# Patient Record
Sex: Female | Born: 1955 | Race: White | Hispanic: No | Marital: Married | Smoking: Never smoker
Health system: Southern US, Community
[De-identification: ages and names within clinical notes are randomized; demographics above are authoritative.]

## PROBLEM LIST (undated history)

## (undated) DIAGNOSIS — I4891 Unspecified atrial fibrillation: Secondary | ICD-10-CM

## (undated) DIAGNOSIS — F32A Depression, unspecified: Secondary | ICD-10-CM

## (undated) DIAGNOSIS — I10 Essential (primary) hypertension: Secondary | ICD-10-CM

## (undated) DIAGNOSIS — R87619 Unspecified abnormal cytological findings in specimens from cervix uteri: Secondary | ICD-10-CM

## (undated) DIAGNOSIS — R112 Nausea with vomiting, unspecified: Secondary | ICD-10-CM

## (undated) DIAGNOSIS — Z9889 Other specified postprocedural states: Secondary | ICD-10-CM

## (undated) DIAGNOSIS — L900 Lichen sclerosus et atrophicus: Secondary | ICD-10-CM

## (undated) DIAGNOSIS — I341 Nonrheumatic mitral (valve) prolapse: Secondary | ICD-10-CM

## (undated) DIAGNOSIS — F419 Anxiety disorder, unspecified: Secondary | ICD-10-CM

## (undated) DIAGNOSIS — F329 Major depressive disorder, single episode, unspecified: Secondary | ICD-10-CM

## (undated) DIAGNOSIS — H9190 Unspecified hearing loss, unspecified ear: Secondary | ICD-10-CM

## (undated) DIAGNOSIS — E785 Hyperlipidemia, unspecified: Secondary | ICD-10-CM

## (undated) HISTORY — DX: Anxiety disorder, unspecified: F41.9

## (undated) HISTORY — DX: Nonrheumatic mitral (valve) prolapse: I34.1

## (undated) HISTORY — PX: COLPOSCOPY: SHX161

## (undated) HISTORY — PX: LAPAROSCOPY: SHX197

## (undated) HISTORY — DX: Essential (primary) hypertension: I10

## (undated) HISTORY — DX: Lichen sclerosus et atrophicus: L90.0

## (undated) HISTORY — DX: Unspecified abnormal cytological findings in specimens from cervix uteri: R87.619

## (undated) HISTORY — DX: Unspecified hearing loss, unspecified ear: H91.90

---

## 1998-03-23 ENCOUNTER — Encounter: Admission: RE | Admit: 1998-03-23 | Discharge: 1998-06-21 | Payer: Self-pay | Admitting: Family Medicine

## 2000-05-09 ENCOUNTER — Encounter: Payer: Self-pay | Admitting: *Deleted

## 2000-05-09 ENCOUNTER — Encounter: Admission: RE | Admit: 2000-05-09 | Discharge: 2000-05-09 | Payer: Self-pay | Admitting: *Deleted

## 2001-01-31 ENCOUNTER — Encounter: Admission: RE | Admit: 2001-01-31 | Discharge: 2001-01-31 | Payer: Self-pay | Admitting: Orthopedic Surgery

## 2001-01-31 ENCOUNTER — Encounter: Payer: Self-pay | Admitting: Orthopedic Surgery

## 2005-01-11 ENCOUNTER — Encounter: Admission: RE | Admit: 2005-01-11 | Discharge: 2005-01-11 | Payer: Self-pay | Admitting: Family Medicine

## 2007-07-29 ENCOUNTER — Other Ambulatory Visit: Admission: RE | Admit: 2007-07-29 | Discharge: 2007-07-29 | Payer: Self-pay | Admitting: Obstetrics & Gynecology

## 2009-08-10 ENCOUNTER — Encounter: Admission: RE | Admit: 2009-08-10 | Discharge: 2009-08-10 | Payer: Self-pay | Admitting: Obstetrics and Gynecology

## 2012-01-16 ENCOUNTER — Other Ambulatory Visit (HOSPITAL_COMMUNITY)
Admission: RE | Admit: 2012-01-16 | Discharge: 2012-01-16 | Disposition: A | Discharge status: Home / Self Care | Payer: BC Managed Care – PPO | Source: Ambulatory Visit | Attending: Family Medicine | Admitting: Family Medicine

## 2012-01-16 ENCOUNTER — Other Ambulatory Visit: Payer: Self-pay | Admitting: Family Medicine

## 2012-01-16 DIAGNOSIS — Z0000 Encounter for general adult medical examination without abnormal findings: Secondary | ICD-10-CM | POA: Insufficient documentation

## 2012-01-23 ENCOUNTER — Other Ambulatory Visit: Payer: Self-pay | Admitting: Family Medicine

## 2012-01-23 DIAGNOSIS — Z780 Asymptomatic menopausal state: Secondary | ICD-10-CM

## 2012-02-11 ENCOUNTER — Ambulatory Visit
Admission: RE | Admit: 2012-02-11 | Discharge: 2012-02-11 | Disposition: A | Discharge status: Home / Self Care | Payer: BC Managed Care – PPO | Source: Ambulatory Visit | Attending: Family Medicine | Admitting: Family Medicine

## 2012-02-11 DIAGNOSIS — Z780 Asymptomatic menopausal state: Secondary | ICD-10-CM

## 2013-02-23 ENCOUNTER — Encounter: Payer: Self-pay | Admitting: Certified Nurse Midwife

## 2013-02-24 ENCOUNTER — Ambulatory Visit (INDEPENDENT_AMBULATORY_CARE_PROVIDER_SITE_OTHER): Payer: BC Managed Care – PPO | Admitting: Certified Nurse Midwife

## 2013-02-24 ENCOUNTER — Encounter: Payer: Self-pay | Admitting: Certified Nurse Midwife

## 2013-02-24 VITALS — BP 114/72 | HR 72 | Resp 16 | Ht 62.25 in | Wt 119.0 lb

## 2013-02-24 DIAGNOSIS — N904 Leukoplakia of vulva: Secondary | ICD-10-CM

## 2013-02-24 DIAGNOSIS — N951 Menopausal and female climacteric states: Secondary | ICD-10-CM

## 2013-02-24 DIAGNOSIS — Z0000 Encounter for general adult medical examination without abnormal findings: Secondary | ICD-10-CM

## 2013-02-24 DIAGNOSIS — L940 Localized scleroderma [morphea]: Secondary | ICD-10-CM

## 2013-02-24 DIAGNOSIS — Z01419 Encounter for gynecological examination (general) (routine) without abnormal findings: Secondary | ICD-10-CM

## 2013-02-24 DIAGNOSIS — N952 Postmenopausal atrophic vaginitis: Secondary | ICD-10-CM

## 2013-02-24 LAB — POCT URINALYSIS DIPSTICK
Blood, UA: NEGATIVE
Glucose, UA: NEGATIVE
Ketones, UA: NEGATIVE
Protein, UA: NEGATIVE
Urobilinogen, UA: NEGATIVE

## 2013-02-24 NOTE — Patient Instructions (Addendum)
EXERCISE AND DIET:  We recommended that you start or continue a regular exercise program for good health. Regular exercise means any activity that makes your heart beat faster and makes you sweat.  We recommend exercising at least 30 minutes per day at least 3 days a week, preferably 4 or 5.  We also recommend a diet low in fat and sugar.  Inactivity, poor dietary choices and obesity can cause diabetes, heart attack, stroke, and kidney damage, among others.    ALCOHOL AND SMOKING:  Women should limit their alcohol intake to no more than 7 drinks/beers/glasses of wine (combined, not each!) per week. Moderation of alcohol intake to this level decreases your risk of breast cancer and liver damage. And of course, no recreational drugs are part of a healthy lifestyle.  And absolutely no smoking or even second hand smoke. Most people know smoking can cause heart and lung diseases, but did you know it also contributes to weakening of your bones? Aging of your skin?  Yellowing of your teeth and nails?  CALCIUM AND VITAMIN D:  Adequate intake of calcium and Vitamin D are recommended.  The recommendations for exact amounts of these supplements seem to change often, but generally speaking 600 mg of calcium (either carbonate or citrate) and 800 units of Vitamin D per day seems prudent. Certain women may benefit from higher intake of Vitamin D.  If you are among these women, your doctor will have told you during your visit.    PAP SMEARS:  Pap smears, to check for cervical cancer or precancers,  have traditionally been done yearly, although recent scientific advances have shown that most women can have pap smears less often.  However, every woman still should have a physical exam from her gynecologist every year. It will include a breast check, inspection of the vulva and vagina to check for abnormal growths or skin changes, a visual exam of the cervix, and then an exam to evaluate the size and shape of the uterus and  ovaries.  And after 57 years of age, a rectal exam is indicated to check for rectal cancers. We will also provide age appropriate advice regarding health maintenance, like when you should have certain vaccines, screening for sexually transmitted diseases, bone density testing, colonoscopy, mammograms, etc.   MAMMOGRAMS:  All women over 40 years old should have a yearly mammogram. Many facilities now offer a "3D" mammogram, which may cost around $50 extra out of pocket. If possible,  we recommend you accept the option to have the 3D mammogram performed.  It both reduces the number of women who will be called back for extra views which then turn out to be normal, and it is better than the routine mammogram at detecting truly abnormal areas.    COLONOSCOPY:  Colonoscopy to screen for colon cancer is recommended for all women at age 50.  We know, you hate the idea of the prep.  We agree, BUT, having colon cancer and not knowing it is worse!!  Colon cancer so often starts as a polyp that can be seen and removed at colonscopy, which can quite literally save your life!  And if your first colonoscopy is normal and you have no family history of colon cancer, most women don't have to have it again for 10 years.  Once every ten years, you can do something that may end up saving your life, right?  We will be happy to help you get it scheduled when you are ready.    Be sure to check your insurance coverage so you understand how much it will cost.  It may be covered as a preventative service at no cost, but you should check your particular policy.     Lichen Sclerosus Lichen sclerosus is a skin problem. It can happen on any part of the body, but it commonly involves the anal or genital areas. Lichen sclerosus is not an infection or a fungus. Girls and women are more commonly affected than boys and men. CAUSES The cause is not known. It could be the result of an overactive immune system or a lack of certain hormones.  Lichen sclerosus is not passed from one person to another (not contagious). SYMPTOMS Your skin may have:  Thin, wrinkled, white areas.  Thickened white areas.  Red and swollen patches.  Tears or cracks.  Bruising.  Blood blisters.  Severe itching. You may also have pain, itching, or burning with urination. Constipation is also common in people with lichen sclerosus. DIAGNOSIS Your caregiver will do a physical exam. Sometimes, a tissue sample (biopsy) may be sent for testing. TREATMENT Treatment may involve putting a thin layer of medicated cream (topical steroid) over the areas with lichen sclerosus. Use the cream only as directed by your caregiver.  HOME CARE INSTRUCTIONS  Only take over-the-counter or prescription medicines as directed by your caregiver.  Keep the vaginal area as clean and dry as possible. SEEK MEDICAL CARE IF: You develop increasing pain, swelling, or redness. Document Released: 11/29/2010 Document Revised: 10/01/2011 Document Reviewed: 11/29/2010 [facility] Patient Information 2014 [place], [place]. Atrophic Vaginitis Atrophic vaginitis is a problem of low levels of estrogen in women. This problem can happen at any age. It is most common in women who have gone through menopause ("the change").  HOW WILL I KNOW IF I HAVE THIS PROBLEM? You may have:  Trouble with peeing (urinating), such as:  Going to the bathroom often.  A hard time holding your pee until you reach a bathroom.  Leaking pee.  Having pain when you pee.  Itching or a burning feeling.  Vaginal bleeding and spotting.  Pain during sex.  Dryness of the vagina.  A yellow, bad-smelling fluid (discharge) coming from the vagina. HOW WILL MY DOCTOR CHECK FOR THIS PROBLEM?  During your exam, your doctor will likely find the problem.  If there is a vaginal fluid, it may be checked for infection. HOW WILL THIS PROBLEM BE TREATED? Keep the vulvar skin as clean as possible. Moisturizers  and lubricants can help with some of the symptoms. Estrogen replacement can help. There are 2 ways to take estrogen:  Systemic estrogen gets estrogen to your whole body. It takes many weeks or months before the symptoms get better.  You take an estrogen pill.  You use a skin patch. This is a patch that you put on your skin.  If you still have your uterus, your doctor may ask you to take a hormone. Talk to your doctor about the right medicine for you.  Estrogen cream.  This puts estrogen only at the part of your body where you apply it. The cream is put into the vagina or put on the vulvar skin. For some women, estrogen cream works faster than pills or the patch. CAN ALL WOMEN WITH THIS PROBLEM USE ESTROGEN? No. Women with certain types of cancer, liver problems, or problems with blood clots should not take estrogen. Your doctor can help you decide the best treatment for your symptoms. Document Released: 12/26/2007 Document  Revised: 10/01/2011 Document Reviewed: 12/26/2007 [facility] Patient Information 2014 [place], [place].

## 2013-02-24 NOTE — Progress Notes (Signed)
57 y.o. G82P1001 Married Caucasian Fe here for annual exam. Menopausal now? No period in more than one year. Denies any spotting or bleeding since LMP. Occasional hot flashes, no insomnia issues. Patient does have vaginal dryness and has tried OTC products with nothing but burning each time. Has stopped all sexual activity, for the past year. Patient very concerned with estrogen use vaginally. Would never use oral HRT. Patient on medication for anxiety and working well with PCP management. See PCP aex, labs. No other health issues today. 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  Patient's last menstrual period was 10/22/2011.          Sexually active: no  The current method of family planning is vasectomy.    Exercising: yes  walking & weights Smoker:  no  Health Maintenance: Pap:  12-29-09 neg MMG: 08-10-09, pt unsure if one was done in 2012 Colonoscopy:  none BMD:  02-11-12 TDaP:  6/10 Labs: Poct urine-neg, Hgb- Self breast exam: not done   reports that she has never smoked. She does not have any smokeless tobacco history on file. She reports that she drinks about 1.0 ounces of alcohol per week. She reports that she does not use illicit drugs.  Past Medical History  Diagnosis Date  . Hypertension   . MVP (mitral valve prolapse)   . Anxiety   . Hearing loss     bilateral hearing aids    Past Surgical History  Procedure Laterality Date  . Laparoscopy      exploratory  . Cesarean section      Current Outpatient Prescriptions  Medication Sig Dispense Refill  . ALPRAZolam (XANAX) 0.5 MG tablet Take 0.5 mg by mouth 2 (two) times daily as needed.       . Glucosamine HCl (GLUCOSAMINE PO) Take by mouth daily.      Marland Kitchen losartan-hydrochlorothiazide (HYZAAR) 50-12.5 MG per tablet Take 1 tablet by mouth daily.      . Multiple Vitamins-Minerals (MULTIVITAMIN PO) Take by mouth daily.      Marland Kitchen PARoxetine HCl (PAXIL PO) Take 40 mg by mouth daily.        No current facility-administered medications for this visit.    Family History  Problem Relation Age of Onset  . Osteoporosis Mother   . Hypertension Mother   . Diabetes Father     ROS:  Pertinent items are noted in HPI.  Otherwise, a comprehensive ROS was negative.  Exam:   BP 114/72  Pulse 72  Resp 16  Ht 5' 2.25" (1.581 m)  Wt 119 lb (53.978 kg)  BMI 21.59 kg/m2  LMP 10/22/2011 Height: 5' 2.25" (158.1 cm)  Ht Readings from Last 3 Encounters:  02/24/13 5' 2.25" (1.581 m)    General appearance: alert, cooperative and appears stated age Head: Normocephalic, without obvious abnormality, atraumatic Neck: no adenopathy, supple, symmetrical, trachea midline and thyroid normal to inspection and palpation Lungs: clear to auscultation bilaterally Breasts: normal appearance, no masses or tenderness, No nipple retraction or dimpling, No nipple discharge or bleeding, No axillary or supraclavicular adenopathy Heart: regular rate and rhythm Abdomen: soft, non-tender; no masses,  no organomegaly Extremities: extremities normal, atraumatic, no cyanosis or edema Skin: Skin color, texture, turgor normal. No rashes or lesions Lymph nodes: Cervical, supraclavicular, and axillary nodes normal. No abnormal inguinal nodes palpated Neurologic: Grossly normal   Pelvic: External genitalia:  no lesions, lace like pattern of decrease pigmentation c/w Lichen Sclerosus noted on  both labia area              Urethra:  normal appearing urethra with no masses, tenderness or lesions              Bartholin's and Skene's: normal                 Vagina: atrophic appearing vagina with normal color and scant dishcarge, slightly tender to touch, no odor, no lesions              Cervix: normal, non tender              Pap taken: yes Bimanual Exam:  Uterus:  normal size, contour, position, consistency, mobility, non-tender and anteverted              Adnexa: normal adnexa and no mass, fullness, tenderness                Rectovaginal: Confirms               Anus:  normal sphincter tone, no lesions  A:  Well Woman with normal exam  Menopausal? No menses in one year  Atrophic vaginitis  Lichen scelerosus of vulva area  P:   Reviewed health and wellness pertinent to exam  Discussed need for [facility] to make sure not just amenorrhea. Patient agreeable. Discussed if bleeding occurs would be considered PMB and would need to advise  Reviewed findings and discussed estrogen use, OTC  Products or Olive oil.  Patient will try Olive Oil. Instructed to insert 1 tsp. In vagina at hs every night for 2 weeks, then re check in office. Patient agreeable  Reviewed finding, patient already aware of etiology had same on right leg, seen by dermatology. Area in vagina does not itch or bother her she refuses medication use at this point, unless becomes symptomatic. Will advise if occurs.  Pap smear as per guidelines   Mammogram yearly stressed pap smear taken today with [facility]  counseled on breast self exam, mammography screening, use and side effects of HRT, menopause, adequate intake of calcium and vitamin D, diet and exercise, Kegel's  exercises  return annually or prn  An After Visit Summary was printed and given to the patient.

## 2013-02-25 LAB — FOLLICLE STIMULATING HORMONE: FSH: 60.9 m[IU]/mL

## 2013-02-26 ENCOUNTER — Telehealth: Payer: Self-pay

## 2013-02-26 LAB — IPS PAP TEST WITH HPV

## 2013-02-26 NOTE — Telephone Encounter (Signed)
Patient notified of results.

## 2013-02-26 NOTE — Telephone Encounter (Signed)
Message copied by Eliezer Bottom on Thu Feb 26, 2013 10:09 AM ------      Message from: Verner Chol      Created: Wed Feb 25, 2013  7:50 PM       Notify [facility] shows menopausal range, can discuss if questions at follow up visit ------

## 2013-02-26 NOTE — Telephone Encounter (Signed)
lmtcb

## 2013-02-28 NOTE — Progress Notes (Signed)
Note reviewed, agree with plan.  Cora Brierley, MD  

## 2013-03-10 ENCOUNTER — Ambulatory Visit: Payer: BC Managed Care – PPO | Admitting: Certified Nurse Midwife

## 2013-03-17 ENCOUNTER — Ambulatory Visit (INDEPENDENT_AMBULATORY_CARE_PROVIDER_SITE_OTHER): Payer: BC Managed Care – PPO | Admitting: Certified Nurse Midwife

## 2013-03-17 VITALS — BP 126/70 | HR 64 | Resp 16 | Ht 62.25 in | Wt 119.0 lb

## 2013-03-17 DIAGNOSIS — N952 Postmenopausal atrophic vaginitis: Secondary | ICD-10-CM

## 2013-03-17 DIAGNOSIS — N904 Leukoplakia of vulva: Secondary | ICD-10-CM

## 2013-03-17 DIAGNOSIS — L940 Localized scleroderma [morphea]: Secondary | ICD-10-CM

## 2013-03-17 NOTE — Progress Notes (Signed)
57 y.o. Married Caucasian female G1P1001 here for follow up of vaginal dryness treated with Olive oil initiated on August5,2014. Patient has been using Olive oil nightly and used for sexual activity also. Denies vaginal bleeding or pain. Describes vaginal discomfort now, feeling better, but has now started having external vaginal itching, especially on right vulva area. No new personal products, no change in vaginal discharge other Olive Oil, no burning. Onset about 4-5 days ago. Using Olive oil on spouse also which has helped "a lot" .  O: Healthy WD,WN female Affect: normal, orientation x 3 Skin:warm and dry Abdomen:non tender Lymph inguinal: no enlargement Pelvic exam:EXTERNAL GENITALIA: normal appearing vulva w ith decrease pigmentation noted on right and left vulva with lace light pattern consistent with Lichen sclerosus, no redness or tenderness noted. VAGINA: no abnormal discharge or lesions and moisture noted. Atrophic appearance CERVIX: no lesions or cervical motion tenderness and normal appearance  A:Atrophic vaginitis responding well to Olive Oil 2-Lichen scelerosus 3-History of Lichen on right leg resolving.   P: Discussed findings of improved appearance of vaginal mucosa and moisture. Continue Olive oil as instructed, daily 2-reviewed findings of Lichen Scelerosus patient viewed area with mirror. Discussed treatment daily with Rx clobetasol bid x 3 weeks and then evaluate.Patient agreeable, patient has Clobetasol from use with leg.  Will advise if not enough and needs Rx. Questions answered regarding etiology and treatment.  Rv 3 week for recheck, prn

## 2013-03-18 ENCOUNTER — Encounter: Payer: Self-pay | Admitting: Certified Nurse Midwife

## 2013-03-18 NOTE — Progress Notes (Signed)
Note reviewed, agree with plan.  Lonzell Dorris, MD  

## 2013-03-31 ENCOUNTER — Encounter: Payer: Self-pay | Admitting: Certified Nurse Midwife

## 2013-04-07 ENCOUNTER — Encounter: Payer: Self-pay | Admitting: Certified Nurse Midwife

## 2013-04-07 ENCOUNTER — Ambulatory Visit (INDEPENDENT_AMBULATORY_CARE_PROVIDER_SITE_OTHER): Payer: BC Managed Care – PPO | Admitting: Certified Nurse Midwife

## 2013-04-07 VITALS — BP 138/82 | HR 60 | Resp 16 | Ht 62.25 in | Wt 119.0 lb

## 2013-04-07 DIAGNOSIS — L940 Localized scleroderma [morphea]: Secondary | ICD-10-CM

## 2013-04-07 DIAGNOSIS — N904 Leukoplakia of vulva: Secondary | ICD-10-CM

## 2013-04-07 NOTE — Progress Notes (Signed)
57 y.o. Married Caucasian female G1P1001 here for follow up of Lichen scelerosus treated with Clobetasol initiated on 03/18/13.Using medication only once daily with out any problems. Continues with Olive Oil vaginally for dryness with good response. Patient still having some itching on vulva area but not as intense, as before. Rectal area itching has stopped. Stress level has increased due to mother in law severe Vitamin B 12 deficiency with dementia effect., so providing care as needed now. No other health issues today.    O: Healthy WD,WN female Affect: normal, orientation x 3  Skin: warm and dry  Pelvic exam:EXTERNAL GENITALIA: normal appearing vulva with no masses, tenderness or lesions, with decrease pigmentation noted on right and left areas of vulva with lace like pattern consistent with Lichen.  Perineal area very faint lace like pattern noted, with minimal decreased pigmentation noted. No redness or tenderness noted. Vagina: no abnormal discharge, moist, non tender. Atrophic appearance        A:Lichen scelerosus reaponding to Clobetasol cream 2-Atrophic vaginitis responding well to Olive Oil use 3-Social stress with family care   P: Discussed findings of response of area to Clobetasol use. Instructed to use bid x 3 weeks, then once daily Has Rx 2- Continue as discussed 3-Seek family, friends support, and time for self..    RV one month

## 2013-04-13 NOTE — Progress Notes (Signed)
Note reviewed, agree with plan.  Elsie Baynes, MD  

## 2013-04-29 ENCOUNTER — Encounter: Payer: Self-pay | Admitting: Certified Nurse Midwife

## 2013-05-05 ENCOUNTER — Ambulatory Visit: Payer: BC Managed Care – PPO | Admitting: Certified Nurse Midwife

## 2013-05-20 ENCOUNTER — Ambulatory Visit: Payer: BC Managed Care – PPO | Admitting: Certified Nurse Midwife

## 2013-05-28 ENCOUNTER — Other Ambulatory Visit: Payer: Self-pay

## 2013-07-06 ENCOUNTER — Observation Stay (HOSPITAL_COMMUNITY)
Admission: EM | Admit: 2013-07-06 | Discharge: 2013-07-07 | Disposition: A | Discharge status: Home / Self Care | Payer: BC Managed Care – PPO | Attending: Internal Medicine | Admitting: Internal Medicine

## 2013-07-06 ENCOUNTER — Telehealth: Payer: Self-pay | Admitting: Certified Nurse Midwife

## 2013-07-06 ENCOUNTER — Emergency Department (HOSPITAL_COMMUNITY): Payer: BC Managed Care – PPO

## 2013-07-06 ENCOUNTER — Encounter (HOSPITAL_COMMUNITY): Payer: Self-pay | Admitting: Emergency Medicine

## 2013-07-06 DIAGNOSIS — Z8249 Family history of ischemic heart disease and other diseases of the circulatory system: Secondary | ICD-10-CM | POA: Insufficient documentation

## 2013-07-06 DIAGNOSIS — R079 Chest pain, unspecified: Secondary | ICD-10-CM

## 2013-07-06 DIAGNOSIS — F3289 Other specified depressive episodes: Secondary | ICD-10-CM | POA: Insufficient documentation

## 2013-07-06 DIAGNOSIS — F329 Major depressive disorder, single episode, unspecified: Secondary | ICD-10-CM | POA: Diagnosis present

## 2013-07-06 DIAGNOSIS — I341 Nonrheumatic mitral (valve) prolapse: Secondary | ICD-10-CM | POA: Diagnosis present

## 2013-07-06 DIAGNOSIS — I4891 Unspecified atrial fibrillation: Secondary | ICD-10-CM | POA: Insufficient documentation

## 2013-07-06 DIAGNOSIS — I059 Rheumatic mitral valve disease, unspecified: Secondary | ICD-10-CM | POA: Insufficient documentation

## 2013-07-06 DIAGNOSIS — F32A Depression, unspecified: Secondary | ICD-10-CM | POA: Diagnosis present

## 2013-07-06 DIAGNOSIS — E785 Hyperlipidemia, unspecified: Secondary | ICD-10-CM | POA: Insufficient documentation

## 2013-07-06 DIAGNOSIS — F411 Generalized anxiety disorder: Secondary | ICD-10-CM

## 2013-07-06 DIAGNOSIS — F419 Anxiety disorder, unspecified: Secondary | ICD-10-CM | POA: Diagnosis present

## 2013-07-06 DIAGNOSIS — Z79899 Other long term (current) drug therapy: Secondary | ICD-10-CM | POA: Insufficient documentation

## 2013-07-06 DIAGNOSIS — I10 Essential (primary) hypertension: Secondary | ICD-10-CM | POA: Insufficient documentation

## 2013-07-06 DIAGNOSIS — R0789 Other chest pain: Principal | ICD-10-CM | POA: Insufficient documentation

## 2013-07-06 HISTORY — DX: Nausea with vomiting, unspecified: R11.2

## 2013-07-06 HISTORY — DX: Depression, unspecified: F32.A

## 2013-07-06 HISTORY — DX: Major depressive disorder, single episode, unspecified: F32.9

## 2013-07-06 HISTORY — DX: Other specified postprocedural states: Z98.890

## 2013-07-06 HISTORY — DX: Unspecified atrial fibrillation: I48.91

## 2013-07-06 HISTORY — DX: Hyperlipidemia, unspecified: E78.5

## 2013-07-06 LAB — COMPREHENSIVE METABOLIC PANEL
Alkaline Phosphatase: 106 U/L (ref 39–117)
BUN: 21 mg/dL (ref 6–23)
CO2: 31 mEq/L (ref 19–32)
Calcium: 9.4 mg/dL (ref 8.4–10.5)
Creatinine, Ser: 0.71 mg/dL (ref 0.50–1.10)
GFR calc Af Amer: >90 mL/min (ref >90)
Glucose, Bld: 73 mg/dL (ref 70–99)
Potassium: 3.6 mEq/L (ref 3.5–5.1)
Total Protein: 7.5 g/dL (ref 6.0–8.3)

## 2013-07-06 LAB — CBC WITH DIFFERENTIAL/PLATELET
Basophils Relative: 1 % (ref 0–1)
Eosinophils Absolute: 0.2 10*3/uL (ref 0.0–0.7)
Eosinophils Relative: 5 % (ref 0–5)
Hemoglobin: 13.3 g/dL (ref 12.0–15.0)
Lymphocytes Relative: 26 % (ref 12–46)
Lymphs Abs: 1.2 10*3/uL (ref 0.7–4.0)
MCH: 30.9 pg (ref 26.0–34.0)
MCHC: 33.8 g/dL (ref 30.0–36.0)
MCV: 91.4 fL (ref 78.0–100.0)
Monocytes Absolute: 0.4 10*3/uL (ref 0.1–1.0)
Monocytes Relative: 9 % (ref 3–12)
Neutrophils Relative %: 59 % (ref 43–77)
RBC: 4.3 MIL/uL (ref 3.87–5.11)

## 2013-07-06 LAB — POCT I-STAT TROPONIN I: Troponin i, poc: 0 ng/mL (ref 0.00–0.08)

## 2013-07-06 LAB — TROPONIN I: Troponin I: <0.3 ng/mL (ref <0.30)

## 2013-07-06 LAB — D-DIMER, QUANTITATIVE: D-Dimer, Quant: <0.27 ug/mL-FEU (ref 0.00–0.48)

## 2013-07-06 MED ORDER — LOSARTAN POTASSIUM-HCTZ 50-12.5 MG PO TABS: 1.0000 | ORAL_TABLET | Freq: Every day | ORAL | Status: DC

## 2013-07-06 MED ORDER — PAROXETINE HCL 20 MG PO TABS
40.0000 mg | ORAL_TABLET | Freq: Every day | ORAL | Status: DC
  Administered 2013-07-07: 11:00:00 40 mg via ORAL
  Filled 2013-07-06: qty 2

## 2013-07-06 MED ORDER — ENOXAPARIN SODIUM 40 MG/0.4ML ~~LOC~~ SOLN
40.0000 mg | SUBCUTANEOUS | Status: DC
  Administered 2013-07-07: 40 mg via SUBCUTANEOUS
  Filled 2013-07-06 (×2): qty 0.4

## 2013-07-06 MED ORDER — ACETAMINOPHEN 325 MG PO TABS
650.0000 mg | ORAL_TABLET | Freq: Four times a day (QID) | ORAL | Status: DC | PRN
  Administered 2013-07-07: 650 mg via ORAL
  Filled 2013-07-06: qty 2

## 2013-07-06 MED ORDER — HYDROCHLOROTHIAZIDE 12.5 MG PO CAPS
12.5000 mg | ORAL_CAPSULE | Freq: Every day | ORAL | Status: DC
  Administered 2013-07-07: 12:00:00 12.5 mg via ORAL
  Filled 2013-07-06 (×2): qty 1

## 2013-07-06 MED ORDER — ASPIRIN 81 MG PO CHEW
324.0000 mg | CHEWABLE_TABLET | Freq: Once | ORAL | Status: AC
  Administered 2013-07-06: 324 mg via ORAL
  Filled 2013-07-06: qty 4

## 2013-07-06 MED ORDER — ASPIRIN EC 81 MG PO TBEC
81.0000 mg | DELAYED_RELEASE_TABLET | Freq: Every day | ORAL | Status: DC
  Administered 2013-07-07: 11:00:00 81 mg via ORAL
  Filled 2013-07-06: qty 1

## 2013-07-06 MED ORDER — ALPRAZOLAM 0.5 MG PO TABS
0.5000 mg | ORAL_TABLET | Freq: Two times a day (BID) | ORAL | Status: DC | PRN
  Administered 2013-07-06 – 2013-07-07 (×2): 0.5 mg via ORAL
  Filled 2013-07-06 (×2): qty 1

## 2013-07-06 MED ORDER — ALBUTEROL SULFATE (5 MG/ML) 0.5% IN NEBU: 2.5000 mg | INHALATION_SOLUTION | RESPIRATORY_TRACT | Status: DC | PRN

## 2013-07-06 MED ORDER — HYDRALAZINE HCL 20 MG/ML IJ SOLN
10.0000 mg | Freq: Four times a day (QID) | INTRAMUSCULAR | Status: DC | PRN
  Administered 2013-07-07: 10 mg via INTRAVENOUS
  Filled 2013-07-06: qty 1

## 2013-07-06 MED ORDER — ONDANSETRON HCL 4 MG/2ML IJ SOLN: 4.0000 mg | Freq: Four times a day (QID) | INTRAMUSCULAR | Status: DC | PRN

## 2013-07-06 MED ORDER — LOSARTAN POTASSIUM 50 MG PO TABS
50.0000 mg | ORAL_TABLET | Freq: Every day | ORAL | Status: DC
  Administered 2013-07-07: 12:00:00 50 mg via ORAL
  Filled 2013-07-06 (×2): qty 1

## 2013-07-06 MED ORDER — NITROGLYCERIN 0.4 MG SL SUBL: 0.4000 mg | SUBLINGUAL_TABLET | SUBLINGUAL | Status: DC | PRN

## 2013-07-06 MED ORDER — SODIUM CHLORIDE 0.9 % IJ SOLN
3.0000 mL | Freq: Two times a day (BID) | INTRAMUSCULAR | Status: DC
  Administered 2013-07-07 (×2): 3 mL via INTRAVENOUS

## 2013-07-06 MED ORDER — ONDANSETRON HCL 4 MG PO TABS: 4.0000 mg | ORAL_TABLET | Freq: Four times a day (QID) | ORAL | Status: DC | PRN

## 2013-07-06 MED ORDER — ACETAMINOPHEN 650 MG RE SUPP: 650.0000 mg | Freq: Four times a day (QID) | RECTAL | Status: DC | PRN

## 2013-07-06 NOTE — ED Provider Notes (Signed)
CSN: [id number]     Arrival date & time 07/06/13  1357 History   First MD Initiated Contact with Patient 07/06/13 1513     Chief Complaint  Patient presents with  . Chest Pain   (Consider location/radiation/quality/duration/timing/severity/associated sxs/prior Treatment) HPI Pt is a 57yo female with hx of MVP, HTN, and anxiety c/o left sided chest pain that started around 13:30 this afternoon while at work. Pt states pain is constant, sharp in nature, worse with inspiration, 8/10 with inspiration, dull ache, 3/10 at rest.  Reports hx of similar pain, however, this time has been more intense and lasting longer.  Pain also worse with palpation.  Reports hx of "irregular heart beat," however has not seen a cardiologist in "many years." Pt does report working in the year all yesterday and took Advil last night, believes pain may be due to muscle pain.  Denies hx of MI.  Does report strong family hx of CAD, father died from MI in 49s, both gradfathers in their 11s and reports mother has had heart problems.  Denies SOB, nausea, diaphoresis. Denies pain medication PTA.  Past Medical History  Diagnosis Date  . Hypertension   . MVP (mitral valve prolapse)   . Anxiety   . Hearing loss      hearing aid in rt ear  . PONV (postoperative nausea and vomiting)    Past Surgical History  Procedure Laterality Date  . Laparoscopy      exploratory per patient huge scar  . Cesarean section     Family History  Problem Relation Age of Onset  . Osteoporosis Mother   . Hypertension Mother   . Heart attack Mother   . Diabetes Father   . Heart attack Father    History  Substance Use Topics  . Smoking status: Never Smoker   . Smokeless tobacco: Never Used  . Alcohol Use: 1.0 oz/week    2 drink(s) per week     Comment: approx 1 glass wine per week   OB History   Grav Para Term Preterm Abortions TAB SAB Ect Mult Living   1 1 1       1      Review of Systems  Constitutional: Negative for fever,  chills, diaphoresis and fatigue.  Respiratory: Negative for cough and shortness of breath.   Cardiovascular: Positive for chest pain. Negative for palpitations and leg swelling.  Gastrointestinal: Negative for nausea, vomiting, abdominal pain and diarrhea.  Musculoskeletal: Positive for myalgias ( left chest wall). Negative for arthralgias.  All other systems reviewed and are negative.    Allergies  Amoxicillin; Ceclor; Codeine; Penicillins; and Sulfa antibiotics  Home Medications   Current Outpatient Rx  Name  Route  Sig  Dispense  Refill  . ALPRAZolam (XANAX) 0.5 MG tablet   Oral   Take 0.5 mg by mouth 2 (two) times daily as needed for anxiety.          . Glucosamine HCl (GLUCOSAMINE PO)   Oral   Take by mouth daily.         Marland Kitchen losartan-hydrochlorothiazide (HYZAAR) 50-12.5 MG per tablet   Oral   Take 1 tablet by mouth daily.         . Multiple Vitamins-Minerals (MULTIVITAMIN PO)   Oral   Take by mouth daily.         . Omega-3 Fatty Acids (FISH OIL PO)   Oral   Take by mouth daily.         Marland Kitchen PARoxetine  HCl (PAXIL PO)   Oral   Take 40 mg by mouth daily.          BP 168/74  Pulse 63  Temp(Src) 98.4 F (36.9 C) (Oral)  Resp 18  SpO2 100% Physical Exam  Nursing note and vitals reviewed. Constitutional: She appears well-developed and well-nourished. No distress.  Pt lying comfortably in exam bed, NAD.   HENT:  Head: Normocephalic and atraumatic.  Eyes: Conjunctivae are normal. No scleral icterus.  Neck: Normal range of motion.  Cardiovascular: Normal rate, regular rhythm and normal heart sounds.   Pulmonary/Chest: Effort normal and breath sounds normal. No respiratory distress. She has no wheezes. She has no rales. She exhibits tenderness.    No respiratory distress, able to speak in full sentences w/o difficulty. Lungs: CTAB   Abdominal: Soft. Bowel sounds are normal. She exhibits no distension and no mass. There is no tenderness. There is no  rebound and no guarding.  Musculoskeletal: Normal range of motion.  Neurological: She is alert.  Skin: Skin is warm and dry. She is not diaphoretic.    ED Course  Procedures (including critical care time) Labs Review Labs Reviewed  COMPREHENSIVE METABOLIC PANEL - Abnormal; Notable for the following:    AST 39 (*)    All other components within normal limits  CBC WITH DIFFERENTIAL  D-DIMER, QUANTITATIVE  POCT I-STAT TROPONIN I   Imaging Review Dg Chest 2 View  07/06/2013   CLINICAL DATA:  Chest pain and hypertension  EXAM: CHEST  2 VIEW  COMPARISON:  None.  FINDINGS: The lungs are clear. The heart size and pulmonary vascularity are normal. No adenopathy.  There is mid thoracic dextroscoliosis. There is atherosclerotic change in the aorta. No pneumothorax.  IMPRESSION: Scoliosis.  No edema or consolidation.   Electronically Signed   By: Bretta Bang M.D.   On: 07/06/2013 14:37    EKG Interpretation    Date/Time:  Monday July 06 2013 14:05:11 EST Ventricular Rate:  70 PR Interval:  184 QRS Duration: 71 QT Interval:  399 QTC Calculation: 430 R Axis:   91 Text Interpretation:  Normal sinus rhythm Probable left atrial enlargement T wave inversions  I, II, AVL No old tracing to compare Confirmed by GOLDSTON  MD, SCOTT ([id number]) on 07/06/2013 3:14:59 PM            MDM   1. Chest pain    Pt c/o left sided chest pain worse with inspiration and palpation. Hx of yardwork yesterday, however pt also has hx of MVP, "irregular heart beat" and strong family hx of CAD.  Will need at least 2 troponins on pt.    D-dimer: unremarkable, CBC and CMP: unremarkable CXR: scoliosis, no edema or consolidation.    Troponin x2: negative EKG: NSR, probably left atrial enlargement T wave inversions- no old tracing to compare.  Discussed pt with Dr. Criss Alvine and spoke with pt about findings and concern for ACS.  Will consult hospitalist to admit to observation for  Chest pain  r/o.  Spoke with Dr. Waymon Amato who agreed to admit pt for observation tele.       Junius Finner, PA-C 07/06/13 [phone]

## 2013-07-06 NOTE — H&P (Signed)
[facility]  History and Physical  Nicole Warren:[id number] DOB: Nov 09, 1955 DOA: 07/06/2013  Referring physician: EDP PCP: Allean Found, MD  Outpatient Specialists:  1. None  Chief Complaint: Chest pain.  HPI: Nicole Warren is a 57 y.o. female with history of hypertension, dyslipidemia, mitral valve prolapse, anxiety & depression, remotely diagnosed A. fib by heart monitor-was supposed to but has not been taking aspirin 81 mg, remote stress test approximately 20 years ago said to be negative, strong family history of CAD, presented to the ED with complaints of chest pain since this afternoon. She was in her usual state of health until approximately 1:30 PM when she got up from her desk to file some papers and experienced acute onset of left anterior chest pain which was sharp, nonradiating, worsened by deep inspiration, associated with dyspnea but no diaphoresis. The pain was rated as 9/10 at worst. It lasted a few hours and resolved after half an hour of taking aspirin in the ED. She gives history of raking and blowing the yard for approximately 4 hours yesterday which is unusual activity for her but denies any pains yesterday. In the ED, initial blood pressure 206/87 mmHg, POC troponin negative, d-dimer negative and EKG changes but no prior EKG to compare. Hospitalist admission requested. She gives history of intermittent palpitations.   Review of Systems: All systems reviewed and apart from history of presenting illness, are negative.  Past Medical History  Diagnosis Date  . Hypertension   . MVP (mitral valve prolapse)   . Anxiety   . Hearing loss      hearing aid in rt ear  . PONV (postoperative nausea and vomiting)   . Dyslipidemia   . A-fib   . Depression    Past Surgical History  Procedure Laterality Date  . Laparoscopy      exploratory per patient huge scar  . Cesarean section     Social History:  reports that she has never smoked. She has never used  smokeless tobacco. She reports that she drinks about 1.0 ounces of alcohol per week. She reports that she does not use illicit drugs. Married. Independent of activities of daily living. Works at [employer] job.  Allergies  Allergen Reactions  . Amoxicillin Rash  . Ceclor [Cefaclor] Rash  . Codeine Rash  . Penicillins Rash  . Sulfa Antibiotics Rash    Family History  Problem Relation Age of Onset  . Osteoporosis Mother   . Hypertension Mother   . Heart attack Mother   . Diabetes Father   . Heart attack Father     Prior to Admission medications   Medication Sig Start Date End Date Taking? Authorizing Provider  ALPRAZolam Prudy Feeler) 0.5 MG tablet Take 0.5 mg by mouth 2 (two) times daily as needed for anxiety.    Yes Historical Provider, MD  Glucosamine HCl (GLUCOSAMINE PO) Take by mouth daily.   Yes Historical Provider, MD  losartan-hydrochlorothiazide (HYZAAR) 50-12.5 MG per tablet Take 1 tablet by mouth daily.   Yes Historical Provider, MD  Multiple Vitamins-Minerals (MULTIVITAMIN PO) Take by mouth daily.   Yes Historical Provider, MD  Omega-3 Fatty Acids (FISH OIL PO) Take by mouth daily.   Yes Historical Provider, MD  PARoxetine HCl (PAXIL PO) Take 40 mg by mouth daily.   Yes Historical Provider, MD   Physical Exam: Filed Vitals:   07/06/13 1401 07/06/13 1554 07/06/13 1704  BP: 206/87 199/84 168/74  Pulse: 72 63   Temp: 98.1 F (36.7  C) 98.4 F (36.9 C)   TempSrc:  Oral   Resp: 20 18   SpO2: 97% 100%      General exam: Moderately built and nourished pleasant female patient, lying comfortably supine on the gurney in no obvious distress.  Head, eyes and ENT: Nontraumatic and normocephalic. Pupils equally reacting to light and accommodation. Oral mucosa moist.  Neck: Supple. No JVD, carotid bruit or thyromegaly.  Lymphatics: No lymphadenopathy.  Respiratory system: Clear to auscultation. No increased work of breathing. Unable to reproduce chest pain at this time but was  apparently reproducible earlier during EDP evaluation.  Cardiovascular system: S1 and S2 heard, RRR. No JVD, murmurs, gallops, clicks or pedal edema.  Gastrointestinal system: Abdomen is nondistended, soft and nontender. Normal bowel sounds heard. No organomegaly or masses appreciated.  Central nervous system: Alert and oriented. No focal neurological deficits.  Extremities: Symmetric 5 x 5 power. Peripheral pulses symmetrically felt.   Skin: No rashes or acute findings.  Musculoskeletal system: Negative exam.  Psychiatry: Pleasant and cooperative.   Labs on Admission:  Basic Metabolic Panel:  Recent Labs Lab 07/06/13 1412  NA 141  K 3.6  CL 100  CO2 31  GLUCOSE 73  BUN 21  CREATININE 0.71  CALCIUM 9.4   Liver Function Tests:  Recent Labs Lab 07/06/13 1412  AST 39*  ALT 29  ALKPHOS 106  BILITOT 0.4  PROT 7.5  ALBUMIN 4.2   No results found for this basename: LIPASE, AMYLASE,  in the last 168 hours No results found for this basename: AMMONIA,  in the last 168 hours CBC:  Recent Labs Lab 07/06/13 1412  WBC 4.6  NEUTROABS 2.7  HGB 13.3  HCT 39.3  MCV 91.4  PLT 194   Cardiac Enzymes: No results found for this basename: CKTOTAL, CKMB, CKMBINDEX, TROPONINI,  in the last 168 hours  BNP (last 3 results) No results found for this basename: PROBNP,  in the last 8760 hours CBG: No results found for this basename: GLUCAP,  in the last 168 hours  Radiological Exams on Admission: Dg Chest 2 View  07/06/2013   CLINICAL DATA:  Chest pain and hypertension  EXAM: CHEST  2 VIEW  COMPARISON:  None.  FINDINGS: The lungs are clear. The heart size and pulmonary vascularity are normal. No adenopathy.  There is mid thoracic dextroscoliosis. There is atherosclerotic change in the aorta. No pneumothorax.  IMPRESSION: Scoliosis.  No edema or consolidation.   Electronically Signed   By: Bretta Bang M.D.   On: 07/06/2013 14:37    EKG: Independently reviewed. Sinus  rhythm at 70 beats per minute, normal axis, Q waves/T. inversion in leads 1, 2 and aVL-? Intraventricular conduction defect but otherwise no acute changes. QTC 430 milliseconds.  Assessment/Plan Principal Problem:   Chest pain Active Problems:   Hypertension   MVP (mitral valve prolapse)   Anxiety   Dyslipidemia   A-fib   Depression   Atypical chest pain   Atypical chest pain - Possibly musculoskeletal or from her recent physical activity - Currently resolved after a dose of aspirin. - Given history of HTN, HL, strong family history, admit to telemetry for observation and ACS rule out. - Cycle cardiac enzymes, followup EKG and 2-D echo in a.m. - D-dimer negative. - Consider discussing with cardiology in a.m. regarding need for exercise stress test which can be done OP. - Continue aspirin 81 mg.  Hypertension, uncontrolled - Currently precipitated by pain and anxiety. - Continue home antihypertensives and  add when necessary IV hydralazine.  History of A. Fib - Patient states that this was diagnosed by heart monitor approximately 10 years ago and she was advised to cut back on caffeinated products and use aspirin 81 mg daily which she has not. She gives history of intermittent palpitations. - Monitor on telemetry. - Start aspirin 81 mg daily.  History of anxiety and depression - Continue home medications.  - Has seen psychiatrist in past.  History of mitral valve prolapse  History of dyslipidemia - Not on medications at home      Code Status: Full   Family Communication: Discussed with spouse and daughter at bedside.   Disposition Plan: Home, possibly 12/16.   Time spent: 55 minutes.  Marcellus Scott, MD, FACP, FHM. [facility] Pager [phone]  If 7PM-7AM, please contact night-coverage [email or web address] Password [facility] 07/06/2013, 7:34 PM

## 2013-07-06 NOTE — Telephone Encounter (Signed)
Patient said she was returning a call to [place]

## 2013-07-06 NOTE — ED Notes (Signed)
Pt c/o chest pain x 20 mins; sharp pain with inspiration; stabbing pain left chest; previous history of same-not as bad; history of mitral valve prolapse and "irregular heart beat"

## 2013-07-06 NOTE — ED Notes (Signed)
1629 i-stat troponin did not cross over into epic. Value is 0.00 ng/mL.

## 2013-07-06 NOTE — ED Notes (Signed)
Pt states did yard work yesterday also

## 2013-07-06 NOTE — ED Provider Notes (Signed)
Medical screening examination/treatment/procedure(s) were conducted as a shared visit with non-physician practitioner(s) and myself.  I personally evaluated the patient during the encounter.  EKG Interpretation    Date/Time:  Monday July 06 2013 14:05:11 EST Ventricular Rate:  70 PR Interval:  184 QRS Duration: 71 QT Interval:  399 QTC Calculation: 430 R Axis:   91 Text Interpretation:  Normal sinus rhythm Probable left atrial enlargement T wave inversions  I, II, AVL No old tracing to compare Confirmed by Betsie Peckman  MD, Vannessa Godown ([id number]) on 07/06/2013 3:14:59 PM            Patient with atypical chest pain. No associated with ROM or recent infection. Given her risk factors and abnormal EKG (T wave inversions, no comparison EKG) I feel she warrants observation for ACS r/o.  Audree Camel, MD 07/06/13 [phone]

## 2013-07-07 DIAGNOSIS — I519 Heart disease, unspecified: Secondary | ICD-10-CM

## 2013-07-07 DIAGNOSIS — R079 Chest pain, unspecified: Secondary | ICD-10-CM

## 2013-07-07 LAB — TROPONIN I
Troponin I: <0.3 ng/mL (ref <0.30)
Troponin I: <0.3 ng/mL (ref <0.30)

## 2013-07-07 MED ORDER — ASPIRIN 81 MG PO TBEC: 81.0000 mg | DELAYED_RELEASE_TABLET | Freq: Every day | ORAL | Status: AC

## 2013-07-07 NOTE — Progress Notes (Signed)
Echo Lab  2D Echocardiogram completed.  Haille Pardi L Eain Mullendore, RDCS 07/07/2013 1:55 PM

## 2013-07-07 NOTE — Care Management Note (Unsigned)
    Page 1 of 1   07/07/2013     11:12:41 AM   CARE MANAGEMENT NOTE 07/07/2013  Patient:  Nicole Warren, Nicole Warren   Account Number:  [id number]  Date Initiated:  07/07/2013  Documentation initiated by:  Lanier Clam  Subjective/Objective Assessment:   57 Y/O F ADMITTED W/CHEST PAIN.     Action/Plan:   FROM HOME.   Anticipated DC Date:  07/07/2013   Anticipated DC Plan:  HOME/SELF CARE      DC Planning Services  CM consult      Choice offered to / List presented to:             Status of service:  In process, will continue to follow Medicare Important Message given?   (If response is "NO", the following Medicare IM given date fields will be blank) Date Medicare IM given:   Date Additional Medicare IM given:    Discharge Disposition:    Per UR Regulation:  Reviewed for med. necessity/level of care/duration of stay  If discussed at Long Length of Stay Meetings, dates discussed:    Comments:

## 2013-07-07 NOTE — Discharge Summary (Signed)
Physician Discharge Summary  KRISANDRA BUENO ZOX:[id number] DOB: 1955/07/25 DOA: 07/06/2013  PCP: Allean Found, MD  Admit date: 07/06/2013 Discharge date: 07/07/2013  Recommendations for Outpatient Follow-up:  1. Pt will need to follow up with PCP in 2-3 weeks post discharge 2. Please obtain BMP to evaluate electrolytes and kidney function 3. Please also check CBC to evaluate Hg and Hct levels 4. Pt advised to schedule an appointment with cardiologist as soon as possible  5. We have discussed addition of new antihypertensive medication for better BP control 6. Pt advised to monitor BP regularly and to call PCP if the numbers are > 140/90 so that antihypertensive regimen can be readjusted as indicated   Discharge Diagnoses: Chest pain  Principal Problem:   Chest pain Active Problems:   Hypertension   MVP (mitral valve prolapse)   Anxiety   Dyslipidemia   A-fib   Depression   Atypical chest pain  Discharge Condition: Stable  Diet recommendation: Heart healthy diet discussed in details   History of present illness:   57 y.o. female with history of hypertension, dyslipidemia, mitral valve prolapse, anxiety & depression, remotely diagnosed A. fib by heart monitor-was supposed to but has not been taking aspirin 81 mg, remote stress test approximately 20 years ago said to be negative, strong family history of CAD, presented to the ED with complaints of chest pain since this afternoon. She was in her usual state of health until approximately 1:30 PM when she got up from her desk to file some papers and experienced acute onset of left anterior chest pain which was sharp, nonradiating, worsened by deep inspiration, associated with dyspnea but no diaphoresis. The pain was rated as 9/10 at worst. It lasted a few hours and resolved after half an hour of taking aspirin in the ED. She gives history of raking and blowing the yard for approximately 4 hours yesterday which is unusual activity  for her but denies any pains yesterday. In the ED, initial blood pressure 206/87 mmHg, POC troponin negative, d-dimer negative and EKG changes but no prior EKG to compare. Hospitalist admission requested. She gives history of intermittent palpitations.   Hospital Course:  Principal Problem:   Chest pain - possibly related to significantly elevated BP - she denies chest pain this AM and wants to go home - no events on telemetry and CE x 3 negative - advised pt continue taking her home BP medication  - advised pt to schedule an appointment with cardiologist as soon as possible  Active Problems:   Hypertension, urgency  - now much improved - continue home medical regimen - pt wants to discuss addition of new antihypertensive regimen with PCP - close monitoring of BP recommended    MVP (mitral valve prolapse) - close monitoring of BP and cardiology follow up    Dyslipidemia  Procedures/Studies: Dg Chest 2 View   07/06/2013   Scoliosis.  No edema or consolidation.    Consultations:  None   Antibiotics:  None   Discharge Exam: Filed Vitals:   07/07/13 1049  BP: 161/86  Pulse: 85  Temp:   Resp:    Filed Vitals:   07/07/13 0524 07/07/13 0618 07/07/13 0735 07/07/13 1049  BP: 177/83 186/94 151/81 161/86  Pulse: 60   85  Temp: 98 F (36.7 C)     TempSrc: Oral     Resp: 12     Height:      Weight:      SpO2: 99%  General: Pt is alert, follows commands appropriately, not in acute distress Cardiovascular: Regular rate and rhythm, S1/S2 +, no murmurs, no rubs, no gallops Respiratory: Clear to auscultation bilaterally, no wheezing, no crackles, no rhonchi Abdominal: Soft, non tender, non distended, bowel sounds +, no guarding Extremities: no edema, no cyanosis, pulses palpable bilaterally DP and PT Neuro: Grossly nonfocal  Discharge Instructions  Discharge Orders   Future Orders Complete By Expires   Diet - low sodium heart healthy  As directed    Increase  activity slowly  As directed        Medication List         ALPRAZolam 0.5 MG tablet  Commonly known as:  XANAX  Take 0.5 mg by mouth 2 (two) times daily as needed for anxiety.     aspirin 81 MG EC tablet  Take 1 tablet (81 mg total) by mouth daily.     FISH OIL PO  Take by mouth daily.     GLUCOSAMINE PO  Take by mouth daily.     losartan-hydrochlorothiazide 50-12.5 MG per tablet  Commonly known as:  HYZAAR  Take 1 tablet by mouth daily.     MULTIVITAMIN PO  Take by mouth daily.     PAXIL PO  Take 40 mg by mouth daily.           Follow-up Information   Follow up with Allean Found, MD In 2 weeks.   Specialty:  Family Medicine   Contact information:   [address], [address] [place] [address] [phone]       Follow up with Debbora Presto, MD. (As needed if symptoms worsen call my cell phone [phone])    Specialty:  Internal Medicine   Contact information:   [address] [place] [address] [phone]        The results of significant diagnostics from this hospitalization (including imaging, microbiology, ancillary and laboratory) are listed below for reference.     Microbiology: No results found for this or any previous visit (from the past 240 hour(s)).   Labs: Basic Metabolic Panel:  Recent Labs Lab 07/06/13 1412  NA 141  K 3.6  CL 100  CO2 31  GLUCOSE 73  BUN 21  CREATININE 0.71  CALCIUM 9.4   Liver Function Tests:  Recent Labs Lab 07/06/13 1412  AST 39*  ALT 29  ALKPHOS 106  BILITOT 0.4  PROT 7.5  ALBUMIN 4.2   CBC:  Recent Labs Lab 07/06/13 1412  WBC 4.6  NEUTROABS 2.7  HGB 13.3  HCT 39.3  MCV 91.4  PLT 194   Cardiac Enzymes:  Recent Labs Lab 07/06/13 2103 07/07/13 0224 07/07/13 0800  TROPONINI <0.30 <0.30 <0.30   SIGNED: Time coordinating discharge: Over 30 minutes  Debbora Presto, MD  [facility] 07/07/2013, 12:34 PM Pager [phone]  If 7PM-7AM,  please contact night-coverage [email or web address] Password [email or web address]

## 2013-07-13 ENCOUNTER — Encounter: Payer: Self-pay | Admitting: *Deleted

## 2013-07-13 ENCOUNTER — Other Ambulatory Visit: Payer: Self-pay | Admitting: *Deleted

## 2013-07-14 ENCOUNTER — Encounter: Payer: Self-pay | Admitting: Interventional Cardiology

## 2013-07-14 ENCOUNTER — Ambulatory Visit (INDEPENDENT_AMBULATORY_CARE_PROVIDER_SITE_OTHER): Payer: BC Managed Care – PPO | Admitting: Interventional Cardiology

## 2013-07-14 VITALS — BP 136/82 | HR 85 | Ht 62.0 in | Wt 125.0 lb

## 2013-07-14 DIAGNOSIS — I7300 Raynaud's syndrome without gangrene: Secondary | ICD-10-CM

## 2013-07-14 DIAGNOSIS — R079 Chest pain, unspecified: Secondary | ICD-10-CM

## 2013-07-14 DIAGNOSIS — E785 Hyperlipidemia, unspecified: Secondary | ICD-10-CM

## 2013-07-14 DIAGNOSIS — I10 Essential (primary) hypertension: Secondary | ICD-10-CM

## 2013-07-14 NOTE — Progress Notes (Signed)
Patient ID: LAMEEKA SCHLEIFER, female   DOB: [birth year], 57 y.o.   MRN: [id number]   Date: 07/14/2013 ID: SURIYAH VERGARA, DOB 20-Mar-1956, MRN [id number] PCP: Allean Found, MD  Reason: Chest pain  ASSESSMENT;  1. Chest pain with atypical features left lateral chest and back.. Given her risk factors an ischemic evaluation is needed. I suspect this discomfort was musculoskeletal related to scoliosis. 2. Abnormal EKG with diffuse ST abnormality 3. Family history of premature atherosclerosis 4. History of Raynaud's phenomena 5. Scoliosis  PLAN:  1. Stress Cardiolite 2. Consider orthopedic evaluation for scoliosis   SUBJECTIVE: LANETRA HARTLEY is a 57 y.o. female who is referred because of sudden onset left lateral chest and back discomfort. The discomfort started suddenly and prevented taking a deep breath. This severe discomfort when all for several minutes and resolved. A lingering left chest discomfort persisted after that. She was admitted to the hospital where she ruled out for myocardial infarction. An echo did not demonstrate evidence of pericardial effusion or left ventricular dysfunction. She was discharged an appointment was made to have cardiology evaluation. She has history of scoliosis and Raynaud's phenomena. There is no other history of connective tissue disease. There is a premature atherosclerosis history in the family with her father dying in his late 21s of a myocardial infarction. She denies palpitations, syncope, orthopnea, and PND.   Allergies  Allergen Reactions  . Sulfa Antibiotics Rash    Kidney Failure   . Prednisone   . Amoxicillin Rash  . Ceclor [Cefaclor] Rash    GI upset   . Codeine Rash  . Penicillins Rash    GI upset     Current Outpatient Prescriptions on File Prior to Visit  Medication Sig Dispense Refill  . ALPRAZolam (XANAX) 0.5 MG tablet Take 0.5 mg by mouth 2 (two) times daily as needed for anxiety.       Marland Kitchen aspirin EC 81 MG EC tablet Take 1  tablet (81 mg total) by mouth daily.  30 tablet  1  . Glucosamine HCl (GLUCOSAMINE PO) Take by mouth daily.      Marland Kitchen losartan-hydrochlorothiazide (HYZAAR) 50-12.5 MG per tablet Take 1 tablet by mouth daily.      . Multiple Vitamins-Minerals (MULTIVITAMIN PO) Take by mouth daily.      . Omega-3 Fatty Acids (FISH OIL PO) Take by mouth daily.      Marland Kitchen PARoxetine HCl (PAXIL PO) Take 40 mg by mouth daily.       No current facility-administered medications on file prior to visit.    Past Medical History  Diagnosis Date  . Hypertension   . MVP (mitral valve prolapse)   . Anxiety   . Hearing loss      hearing aid in rt ear  . PONV (postoperative nausea and vomiting)   . Dyslipidemia   . A-fib   . Depression     Past Surgical History  Procedure Laterality Date  . Laparoscopy      exploratory per patient huge scar  . Cesarean section      History   Social History  . Marital Status: Married    Spouse Name: N/A    Number of Children: N/A  . Years of Education: N/A   Occupational History  . Not on file.   Social History Main Topics  . Smoking status: Never Smoker   . Smokeless tobacco: Never Used     Comment: both parents were smokers  . Alcohol Use: 1.0 oz/week  2 drink(s) per week     Comment: approx 1 glass wine per week  . Drug Use: No  . Sexual Activity: No     Comment: husband vasectomy   Other Topics Concern  . Not on file   Social History Narrative  . No narrative on file    Family History  Problem Relation Age of Onset  . Osteoporosis Mother     severe  . Hypertension Mother   . Heart attack Mother   . Diabetes Father   . Heart attack Father   . Asthma Father   . Coronary artery disease Paternal Grandfather   . Heart attack Paternal Grandfather   . Cancer - Other Paternal Grandmother     mouth, snuff user  . Coronary artery disease Maternal Grandfather   . Heart attack Maternal Grandfather   . Congestive Heart Failure Maternal Grandmother      ROS: She denies Transient neurological symptoms, syncope, pleurisy, pericarditis, GI bleeding, difficulty swallowing, joint swelling, and wheezing. Other systems negative for complaints.  OBJECTIVE: BP 136/82  Pulse 85  Ht 5\' 2"  (1.575 m)  Wt 125 lb (56.7 kg)  BMI 22.86 kg/m2,  General: No acute distress, healthy-appearing HEENT: normal no pallor or this Neck: JVD flat. Carotids 2+ without bruits Chest: Clear Cardiac: Murmur: Absent. Gallop: S4. Rhythm: Normal. Other: Normal Abdomen: Bruit: Absent. Pulsation: Absent Extremities: Edema: Absent. Pulses: 2+ and symmetric Neuro: Normal Psych: Ages  ECG: Normal sinus rhythm with diffuse ST abnormality most notable in the inferior and lateral leads.

## 2013-07-14 NOTE — Patient Instructions (Signed)
Your physician recommends that you continue on your current medications as directed. Please refer to the Current Medication list given to you today.  Your physician has requested that you have en exercise stress myoview. For further information please visit [email or web address] Please follow instruction sheet, as given.  Follow up pending results of nuclear study

## 2013-08-04 ENCOUNTER — Telehealth: Payer: Self-pay | Admitting: Interventional Cardiology

## 2013-08-04 ENCOUNTER — Encounter (HOSPITAL_COMMUNITY): Payer: BC Managed Care – PPO

## 2013-08-04 ENCOUNTER — Other Ambulatory Visit: Payer: Self-pay | Admitting: Interventional Cardiology

## 2013-08-04 DIAGNOSIS — R079 Chest pain, unspecified: Secondary | ICD-10-CM

## 2013-08-04 NOTE — Telephone Encounter (Signed)
called pt adv her that Dr.Smith was not in the office today he is  working at the hospital. pt given gxt instructions.pt verbalized understanding.

## 2013-08-04 NOTE — Telephone Encounter (Signed)
New message     Pt want Dr Katrinka BlazingSmith only------want to discuss test Dr Katrinka BlazingSmith want her to have.  Nuclear stress test vs treadmill test.

## 2013-08-04 NOTE — Telephone Encounter (Signed)
Received phone call from pt.  She was scheduled for myoview but her [employer] has denied authorization.  This is the pt I paged you late yesterday on and you ordered a GXT instead which [employer] stated needed to be done instead.  Pt was very upset that she wasn't called until yesterday to cancel test and do a different test.  I tried to explain how we had tried to get this authorized but her insurance company refused to authorize this particular test.  Pt also needs instructions for GXT scheduled 08-05-13 as she didn't received any when she was scheduled.  Pt refuses to speak w/anyone else except you.

## 2013-08-05 ENCOUNTER — Ambulatory Visit (HOSPITAL_COMMUNITY)
Admission: RE | Admit: 2013-08-05 | Discharge: 2013-08-05 | Disposition: A | Discharge status: Home / Self Care | Payer: BC Managed Care – PPO | Source: Ambulatory Visit | Attending: Interventional Cardiology | Admitting: Interventional Cardiology

## 2013-08-05 DIAGNOSIS — R079 Chest pain, unspecified: Secondary | ICD-10-CM

## 2013-08-06 ENCOUNTER — Telehealth: Payer: Self-pay

## 2013-08-06 ENCOUNTER — Encounter: Payer: Self-pay | Admitting: Interventional Cardiology

## 2013-08-06 NOTE — Telephone Encounter (Signed)
Message copied by Jarvis NewcomerPARRIS-GODLEY, LISA S on Thu Aug 06, 2013 10:13 AM ------      Message from: Verdis PrimeSMITH, HENRY      Created: Thu Aug 06, 2013  7:46 AM       No evidence for CAD. No further w/u needed. ------

## 2013-08-06 NOTE — Telephone Encounter (Signed)
2nd attempt returned pt call.pt advised of gxt results and Dr.Smith recommendations.No evidence for CAD. No further w/u needed.pt st that she has symptoms that she still has not received an explanation for.Dr.Smith did call the pt yesterday but did not get her and left a message. adv pt that if she had additonal concerns I would ask Dr.Smith to call her back this afternoon, adv her it would be after 5pm. Pt sts that she did not need him to call back, and would just live and deal with the symptoms she was having.pt then hung up the phone.

## 2013-08-06 NOTE — Telephone Encounter (Signed)
Follow up ° ° ° ° ° ° ° ° ° °Pt returning nurse call  °

## 2013-08-06 NOTE — Telephone Encounter (Signed)
called pt to give gxt results.No evidence for CAD. No further w/u needed.lmom for pt to return call.

## 2013-08-11 ENCOUNTER — Encounter: Payer: Self-pay | Admitting: Interventional Cardiology

## 2013-08-11 NOTE — Progress Notes (Signed)
Patient ID: Nicole LincolnLinda B Warren, female   DOB: 01/19/1956, 58 y.o.   MRN: [id number] Multiple phone calls from patient and multiple attempts to return the calls.

## 2014-02-11 ENCOUNTER — Telehealth: Payer: Self-pay | Admitting: Certified Nurse Midwife

## 2014-02-11 NOTE — Telephone Encounter (Signed)
Patient would like to consult with Sara Chuebbie Leonard about "hormone replacement therapy". Patient would also like an appointment for a "skin issue" and a "knot".

## 2014-02-11 NOTE — Telephone Encounter (Signed)
Spoke with patient. Patient states that she has a "pimple" in her vagina that Debbi has seen before. Debbi recommended olive oil for dryness which has caused irritation to the vaginal bump. The bump has started to hurt and she would like Verner Choleborah S. Leonard CNM to look at it again. Patient has also noticed a new "knot" under her skin on the right side of her vagina. Noticed this 2 months ago. Spot is not growing in size and is not painful. Patient has been diagnosed with lichen sclerosus and would like to have that evaluated as well. Patient requesting morning appointment. Appointment scheduled for Tuesday 7/28 at 9:15am with Verner Choleborah S. Leonard CNM. Agreeable to date and time.  Routing to provider for final review. Patient agreeable to disposition. Will close encounter

## 2014-02-16 ENCOUNTER — Encounter: Payer: Self-pay | Admitting: Certified Nurse Midwife

## 2014-02-16 ENCOUNTER — Ambulatory Visit (INDEPENDENT_AMBULATORY_CARE_PROVIDER_SITE_OTHER): Payer: BC Managed Care – PPO | Admitting: Certified Nurse Midwife

## 2014-02-16 VITALS — BP 140/98 | HR 76 | Resp 16 | Ht 62.0 in | Wt 124.0 lb

## 2014-02-16 DIAGNOSIS — L723 Sebaceous cyst: Secondary | ICD-10-CM

## 2014-02-16 NOTE — Progress Notes (Signed)
58 y.o. separated in the same house [id number] here with complaint of vaginal symptoms of bump in vulva area that has increased in size on right side. Area becomes sore at times and then resolved. No new problems. When she uses Olive Oil for dryness soreness increases, but dryness is relieved. Patient has history of these and she would squeeze and it would resolve. No change in vaginal discharge. Onset of symptoms comes and goes Denies itching area,just soreness. Feels Lichen maybe reoccurring... no STD concerns. Urinary symptoms none . Seen in ER 07/14/13 and was diagnosed with Atrial Fib. Patient released from care being followed with PCP.   O:Healthy female WDWN Affect: normal, orientation x 3  Exam: Abdomen:soft Lymph node: no enlargement or tenderness Pelvic exam: External genital:very small sebaceous cysts noted per picture. Slight increase pink , no exudate. Patient shown with mirror. Very slightly tender. Decrease pigmentation noted on vulva, but no change from previous exam. Area checked by Dr Farrel GobbleLathrop agrees with finding.  BUS: negative Vagina: normal at introitus, atrophic, moist, normal discharge noted.  Cervix: normal, non tender Uterus: normal, non tender Adnexa:normal, non tender, no masses or fullness noted   A:Sebaceous cyst of right labia History of Lichen Scelorsus, no flare Atrophic vaginitis uses Olive Oil with good response until recently    P:Discussed findings of sebaceous cysts and etiology. Discussed epsom salt sitz bath for comfort.  Discussed benign findings and to avoid squeezing area, which causes irritation. Switch from Olive oil to Coconut oil for dryness. Estrogen contraindicated due to 3 hypertensive medications and recent cardio evaluation. Patient aware.   Rv prn

## 2014-02-16 NOTE — Patient Instructions (Signed)
Epidermal Cyst An epidermal cyst is sometimes called a sebaceous cyst, epidermal inclusion cyst, or infundibular cyst. These cysts usually contain a substance that looks "pasty" or "cheesy" and may have a bad smell. This substance is a protein called keratin. Epidermal cysts are usually found on the face, neck, or trunk. They may also occur in the vaginal area or other parts of the genitalia of both men and women. Epidermal cysts are usually small, painless, slow-growing bumps or lumps that move freely under the skin. It is important not to try to pop them. This may cause an infection and lead to tenderness and swelling. CAUSES  Epidermal cysts may be caused by a deep penetrating injury to the skin or a plugged hair follicle, often associated with acne. SYMPTOMS  Epidermal cysts can become inflamed and cause:  Redness.  Tenderness.  Increased temperature of the skin over the bumps or lumps.  Grayish-white, bad smelling material that drains from the bump or lump. DIAGNOSIS  Epidermal cysts are easily diagnosed by your caregiver during an exam. Rarely, a tissue sample (biopsy) may be taken to rule out other conditions that may resemble epidermal cysts. TREATMENT   Epidermal cysts often get better and disappear on their own. They are rarely ever cancerous.  If a cyst becomes infected, it may become inflamed and tender. This may require opening and draining the cyst. Treatment with antibiotics may be necessary. When the infection is gone, the cyst may be removed with minor surgery.  Small, inflamed cysts can often be treated with antibiotics or by injecting steroid medicines.  Sometimes, epidermal cysts become large and bothersome. If this happens, surgical removal in your caregiver's office may be necessary. HOME CARE INSTRUCTIONS  Only take over-the-counter or prescription medicines as directed by your caregiver.  Take your antibiotics as directed. Finish them even if you start to feel  better. SEEK MEDICAL CARE IF:   Your cyst becomes tender, red, or swollen.  Your condition is not improving or is getting worse.  You have any other questions or concerns. MAKE SURE YOU:  Understand these instructions.  Will watch your condition.  Will get help right away if you are not doing well or get worse. Document Released: 06/09/2004 Document Revised: 10/01/2011 Document Reviewed: 01/15/2011 Exit[employer] Patient Information 2015 [employer]. This information is not intended to replace advice given to you by your health care provider. Make sure you discuss any questions you have with your health care provider.  

## 2014-02-19 NOTE — Progress Notes (Signed)
Note reviewed, agree with plan.  Remiel Corti, MD  

## 2014-05-24 ENCOUNTER — Encounter: Payer: Self-pay | Admitting: Certified Nurse Midwife

## 2014-10-13 ENCOUNTER — Encounter: Payer: Self-pay | Admitting: Certified Nurse Midwife

## 2014-10-13 ENCOUNTER — Ambulatory Visit (INDEPENDENT_AMBULATORY_CARE_PROVIDER_SITE_OTHER): Payer: BC Managed Care – PPO | Admitting: Certified Nurse Midwife

## 2014-10-13 VITALS — BP 120/80 | HR 70 | Resp 16 | Ht 62.25 in | Wt 123.0 lb

## 2014-10-13 DIAGNOSIS — Z01419 Encounter for gynecological examination (general) (routine) without abnormal findings: Secondary | ICD-10-CM

## 2014-10-13 DIAGNOSIS — N390 Urinary tract infection, site not specified: Secondary | ICD-10-CM | POA: Diagnosis not present

## 2014-10-13 DIAGNOSIS — N904 Leukoplakia of vulva: Secondary | ICD-10-CM | POA: Diagnosis not present

## 2014-10-13 DIAGNOSIS — Z124 Encounter for screening for malignant neoplasm of cervix: Secondary | ICD-10-CM | POA: Diagnosis not present

## 2014-10-13 DIAGNOSIS — R32 Unspecified urinary incontinence: Secondary | ICD-10-CM | POA: Diagnosis not present

## 2014-10-13 MED ORDER — NITROFURANTOIN MONOHYD MACRO 100 MG PO CAPS: ORAL_CAPSULE | ORAL | Status: DC

## 2014-10-13 NOTE — Patient Instructions (Signed)

## 2014-10-13 NOTE — Progress Notes (Signed)
59 y.o. [id number] Married  Caucasian Fe here for annual exam.  Menopausal no HRT. Denies vaginal bleeding or vaginal dryness. Sees PCP for Hypertension/ medication management, aex and labs.. Patient was seen for Atrial Fib at ER, on aspirin only. Macrobid working well for post coital UTI, needs refill. Patient contemplating leaving spouse, due to long history of discord. Daughter will be graduating from high school and going to college for PT. Patient has been in counseling for years and spouse would not attend. She has good support with church and friends. Denies thoughts of hurting self or others. Battling constipation again, using fiber and increase water. Having periods of incontinence if she does not empty completely. Denies urinary frequency or urgency or pain.Has been occurring " for a while". Lichen sclerosus flare this past year with good results with Clobetasol use. Feels it may be starting again due to stress level. No other health issues today.  Patient's last menstrual period was 09/21/2011.          Sexually active: Yes.    The current method of family planning is vasectomy.    Exercising: Yes.    walking Smoker:  no  Health Maintenance: Pap: 02-24-13 neg HPV HR neg. MMG:  08-10-09 Colonoscopy:  2013 per patient negative BMD:   02-11-12 TDaP: 6/10  Labs: none Self breast exam: not done   reports that she has never smoked. She has never used smokeless tobacco. She reports that she drinks alcohol. She reports that she does not use illicit drugs.  Past Medical History  Diagnosis Date  . Hypertension   . MVP (mitral valve prolapse)   . Anxiety   . Hearing loss      hearing aid in rt ear  . PONV (postoperative nausea and vomiting)   . Dyslipidemia   . A-fib   . Depression   . Lichen sclerosus   . Abnormal Pap smear of cervix     Past Surgical History  Procedure Laterality Date  . Laparoscopy      exploratory per patient huge scar  . Cesarean section    . Colposcopy       Current Outpatient Prescriptions  Medication Sig Dispense Refill  . ALPRAZolam (XANAX) 0.5 MG tablet Take 0.5 mg by mouth 2 (two) times daily as needed for anxiety.     Marland Kitchen. aspirin EC 81 MG EC tablet Take 1 tablet (81 mg total) by mouth daily. 30 tablet 1  . busPIRone (BUSPAR) 5 MG tablet Take 5 mg by mouth 3 (three) times daily.     . Glucosamine HCl (GLUCOSAMINE PO) Take by mouth daily.    Marland Kitchen. losartan-hydrochlorothiazide (HYZAAR) 50-12.5 MG per tablet Take 1 tablet by mouth daily.    . Multiple Vitamins-Minerals (MULTIVITAMIN PO) Take by mouth daily.    . Omega-3 Fatty Acids (FISH OIL PO) Take by mouth daily.    Marland Kitchen. PARoxetine HCl (PAXIL PO) Take 40 mg by mouth daily.     No current facility-administered medications for this visit.    Family History  Problem Relation Age of Onset  . Osteoporosis Mother     severe  . Hypertension Mother   . Heart attack Mother   . Diabetes Father   . Heart attack Father   . Asthma Father   . Coronary artery disease Paternal Grandfather   . Heart attack Paternal Grandfather   . Cancer - Other Paternal Grandmother     mouth, snuff user  . Coronary artery disease Maternal Grandfather   .  Heart attack Maternal Grandfather   . Congestive Heart Failure Maternal Grandmother     ROS:  Pertinent items are noted in HPI.  Otherwise, a comprehensive ROS was negative.  Exam:   BP 120/80 mmHg  Pulse 70  Resp 16  Ht 5' 2.25" (1.581 m)  Wt 123 lb (55.792 kg)  BMI 22.32 kg/m2  LMP 09/21/2011 Height: 5' 2.25" (158.1 cm) Ht Readings from Last 3 Encounters:  10/13/14 5' 2.25" (1.581 m)  02/16/14  (1.575 m)  07/14/13  (1.575 m)    General appearance: alert, cooperative and appears stated age Head: Normocephalic, without obvious abnormality, atraumatic Neck: no adenopathy, supple, symmetrical, trachea midline and thyroid normal to inspection and palpation Lungs: clear to auscultation bilaterally Breasts: normal appearance, no masses or  tenderness, No nipple retraction or dimpling, No nipple discharge or bleeding, No axillary or supraclavicular adenopathy Heart: regular rate and rhythm Abdomen: soft, non-tender; no masses,  no organomegaly Extremities: extremities normal, atraumatic, no cyanosis or edema Skin: Skin color, texture, turgor normal. No rashes or lesions Lymph nodes: Cervical, supraclavicular, and axillary nodes normal. No abnormal inguinal nodes palpated Neurologic: Grossly normal   Pelvic: External genitalia:  no lesions, small area of loss of pigmentation with lace like pattern on right vulva, previous area of LS, non tender,no other skin changes              Urethra:  normal appearing urethra with no masses, tenderness or lesions, no leaking with cough or bearing down              Bartholin's and Skene's: normal                 Vagina: normal appearing vagina with normal color and discharge, no lesions, good support              Cervix: normal, non tender, no lesions              Pap taken: Yes.   Bimanual Exam:  Uterus:  normal size, contour, position, consistency, mobility, non-tender              Adnexa: normal adnexa and no mass, fullness, tenderness               Rectovaginal: Confirms               Anus:  normal sphincter tone, no lesions    A:  Well Woman with normal exam  Menopausal no HRT  Periodic incontinence ? Stress induced  Lichen Sclerosus flare, history of with good response with Clobetasol, she has RX  Post Coital UTI, Macrobid working well  Constipation unchanged with changing diet, plans visit with GI  Anxiety/ Depression chronic history with PCP management and counseling  Hypertension well controlled with medication with PCP  P:   Reviewed health and wellness pertinent to exam  Aware of need to evaluate if vaginal bleeding  R/O UTI  Lab Urine culture Discussed work on [employer] and not hold urine for long periods of time, if not resolving may need visit with Dr. Edward Jolly, patient will  advise  Discussed LS  Flares increase with stress and importance of treating as instructed. Patient aware.  Rx Macrobid see order  Continue follow up with PCP as indicated  Pap smear taken today with HPV reflex   counseled on breast self exam, mammography screening stressed, patient does not plan to have another soon, stressed SBE and advise if change, adequate intake of calcium and  vitamin D, diet and exercise return annually or prn  An After Visit Summary was printed and given to the patient.

## 2014-10-14 LAB — IPS PAP TEST WITH REFLEX TO HPV

## 2014-10-15 LAB — URINE CULTURE
Colony Count: NO GROWTH
Organism ID, Bacteria: NO GROWTH

## 2014-10-20 NOTE — Progress Notes (Signed)
Reviewed personally.  M. Suzanne Natausha Jungwirth, MD.  

## 2015-01-04 ENCOUNTER — Other Ambulatory Visit: Payer: Self-pay

## 2015-01-04 ENCOUNTER — Other Ambulatory Visit: Payer: Self-pay | Admitting: Family Medicine

## 2015-01-04 DIAGNOSIS — Z1231 Encounter for screening mammogram for malignant neoplasm of breast: Secondary | ICD-10-CM

## 2015-01-17 ENCOUNTER — Ambulatory Visit: Payer: BC Managed Care – PPO

## 2015-01-17 ENCOUNTER — Ambulatory Visit
Admission: RE | Admit: 2015-01-17 | Discharge: 2015-01-17 | Disposition: A | Discharge status: Home / Self Care | Payer: BC Managed Care – PPO | Source: Ambulatory Visit

## 2015-01-17 ENCOUNTER — Ambulatory Visit
Admission: RE | Admit: 2015-01-17 | Discharge: 2015-01-17 | Disposition: A | Discharge status: Home / Self Care | Payer: BC Managed Care – PPO | Source: Ambulatory Visit | Attending: Family Medicine | Admitting: Family Medicine

## 2015-01-17 DIAGNOSIS — Z1231 Encounter for screening mammogram for malignant neoplasm of breast: Secondary | ICD-10-CM

## 2015-01-17 DIAGNOSIS — M8580 Other specified disorders of bone density and structure, unspecified site: Secondary | ICD-10-CM

## 2015-01-27 ENCOUNTER — Other Ambulatory Visit: Payer: Self-pay | Admitting: Family Medicine

## 2015-01-27 DIAGNOSIS — M8580 Other specified disorders of bone density and structure, unspecified site: Secondary | ICD-10-CM

## 2015-03-11 ENCOUNTER — Encounter: Payer: Self-pay | Admitting: Certified Nurse Midwife

## 2015-03-11 ENCOUNTER — Ambulatory Visit (INDEPENDENT_AMBULATORY_CARE_PROVIDER_SITE_OTHER): Payer: BC Managed Care – PPO | Admitting: Certified Nurse Midwife

## 2015-03-11 ENCOUNTER — Telehealth: Payer: Self-pay | Admitting: Certified Nurse Midwife

## 2015-03-11 VITALS — BP 120/78 | HR 76 | Temp 98.6°F | Resp 16 | Ht 62.25 in | Wt 125.0 lb

## 2015-03-11 DIAGNOSIS — R8299 Other abnormal findings in urine: Secondary | ICD-10-CM | POA: Diagnosis not present

## 2015-03-11 DIAGNOSIS — S3140XA Unspecified open wound of vagina and vulva, initial encounter: Secondary | ICD-10-CM | POA: Diagnosis not present

## 2015-03-11 DIAGNOSIS — S3141XA Laceration without foreign body of vagina and vulva, initial encounter: Secondary | ICD-10-CM

## 2015-03-11 DIAGNOSIS — R82998 Other abnormal findings in urine: Secondary | ICD-10-CM

## 2015-03-11 LAB — POCT URINALYSIS DIPSTICK
BILIRUBIN UA: NEGATIVE
Glucose, UA: NEGATIVE
KETONES UA: NEGATIVE
Leukocytes, UA: NEGATIVE
Nitrite, UA: NEGATIVE
PH UA: 7
Protein, UA: NEGATIVE
RBC UA: NEGATIVE
Urobilinogen, UA: NEGATIVE

## 2015-03-11 NOTE — Progress Notes (Signed)
59 y.o.Married white female [id number] here with complaint of pain  vaginally from use of vaginal vibrator for sexual activity and it came a part and she has pain from this occurrence for the past 3-4 days. She noted shards of device from vagina.  Describes discharge as normal. Started on Macrobid as she thought it was the start of UTI with no change in symptoms.. Denies new other personal products.She also has menopausal vaginal  dryness. No STD concerns. Urinary symptoms none .   Urine negative: Ph 7.0  ROS:  Complete ROS questions negative except as per HPI  O:Healthy female WDWN Affect: normal, orientation x 3  Exam: Abdomen:soft, non tender, negative suprpubic  Inguinal Lymph nodes: no enlargement or tenderness Pelvic exam: External genital: normal female atrophic,  BUS: negative Bladder and urethral meatus non tender  Vagina: scant discharge noted.Thoroughly examined vagina with lubricated Q-Tip and and removed gritty substance from posterior fornix and reswabbed all areas, no other substance or pieces of personal equipment noted. Device use was pink.  Small skid mark type superficial lacerations healing noted in posterior fornix and on left at entrance of vagina, slightly tender, atrophy noted. Cervix: normal, non tender, no CMT Uterus: normal, non tender Adnexa:normal, non tender, no masses or fullness noted  A:Normal pelvic exam Broken personal device while using for sexual activity, no pieces noted Superficial vaginal lacerations healing Atrophic vaginitis History of LS, none noted  P:Discussed findings of skid mark type lacerations that are healing and etiology. Discussed Aveeno  sitz bath for comfort. Avoid moist clothes or pads for extended period of time. Discussed atrophic dry tissue is contributing to discomfort. Patient can not use Estrogen products due to A-Fib. Discussed applicator full of coconut oil 2-3 times daily to hydrate and facilitate healing. Given applicator to  use. Patient feels this will work. Reassured. Avoid sexual activity x 2-3 weeks or her comfort level improves. Call if not change or increases. Stop Macrobid( uses for post coital) increase po water.

## 2015-03-11 NOTE — Telephone Encounter (Signed)
Spoke with patient. Patient states that earlier this week she was using a vibrator and it broke. States the vibrator was made of rubber and plastic. Is unsure if part of the vibrator is inside her vagina. Has been experiencing vaginal discomfort all week. Started taking Macrobid that she uses after intercourse for UTI. Feels she has not had any relief with using Macrobid. Denies any vaginal discharge, odor, or fever. Urine is darker than normal with increased water intake. Is experiencing constant discomfort. Advised will need to be seen in office today for further evaluation with Verner Chol CNM. Patient is agreeable and verbalizes understanding. Appointment scheduled for today at 1:45pm with Verner Chol CNM. Agreeable to date and time.  Routing to provider for final review. Patient agreeable to disposition. Will close encounter.   Patient aware provider will review message and nurse will return call if any additional advice or change of disposition.

## 2015-03-11 NOTE — Telephone Encounter (Signed)
Patient is asking to talk to Sara Chu directly. I told patient a triage nurse would need to call her first. No further details given. Last seen 10/13/14.

## 2015-03-11 NOTE — Progress Notes (Signed)
Reviewed personally.  M. Suzanne Pearlina Friedly, MD.  

## 2015-07-25 IMAGING — CR DG CHEST 2V
2 series · 2 of 2 positions shown · non-contrast
Comparison: None.

CLINICAL DATA: Chest pain and hypertension

EXAM:
CHEST  2 VIEW

[w chest pa]
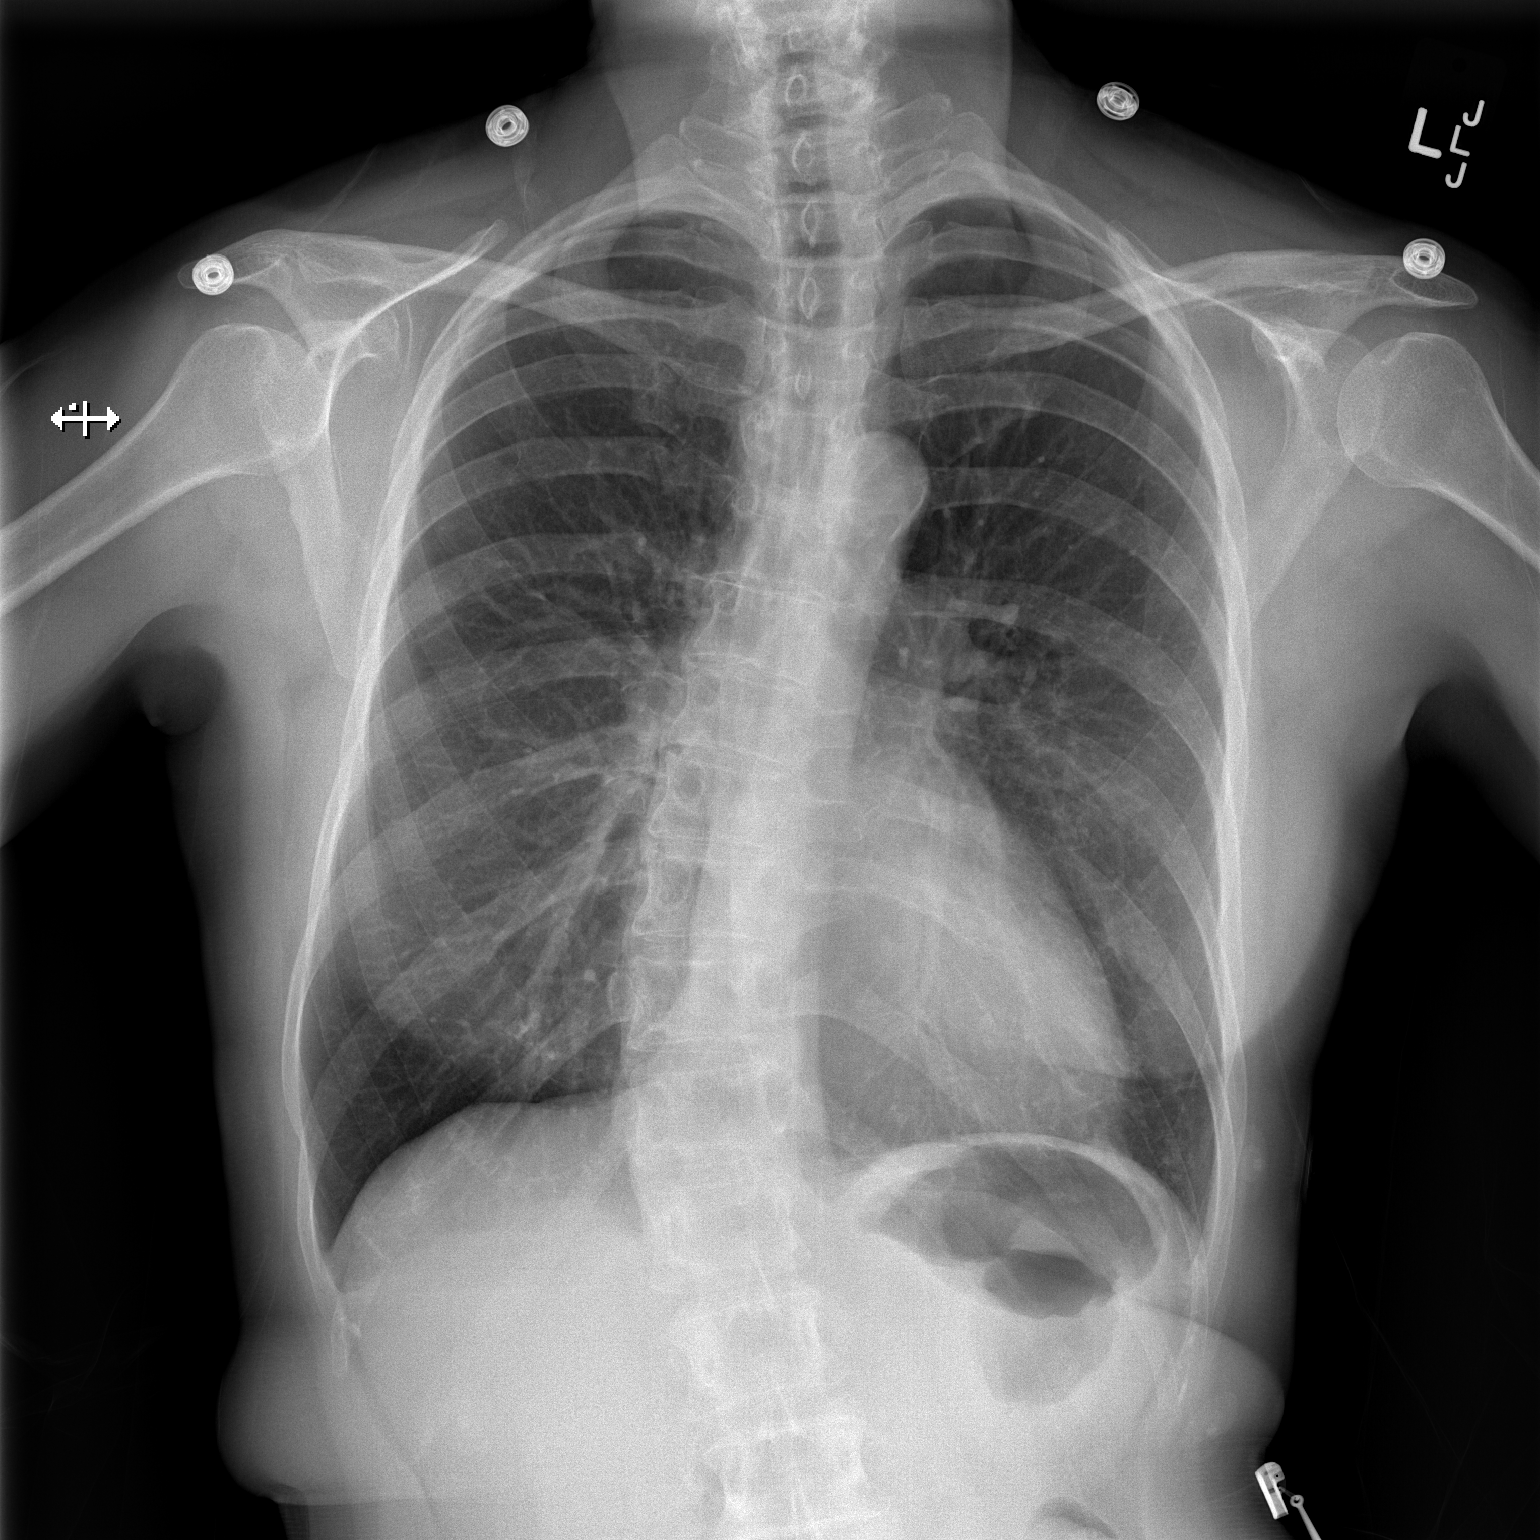

[w chest lat]
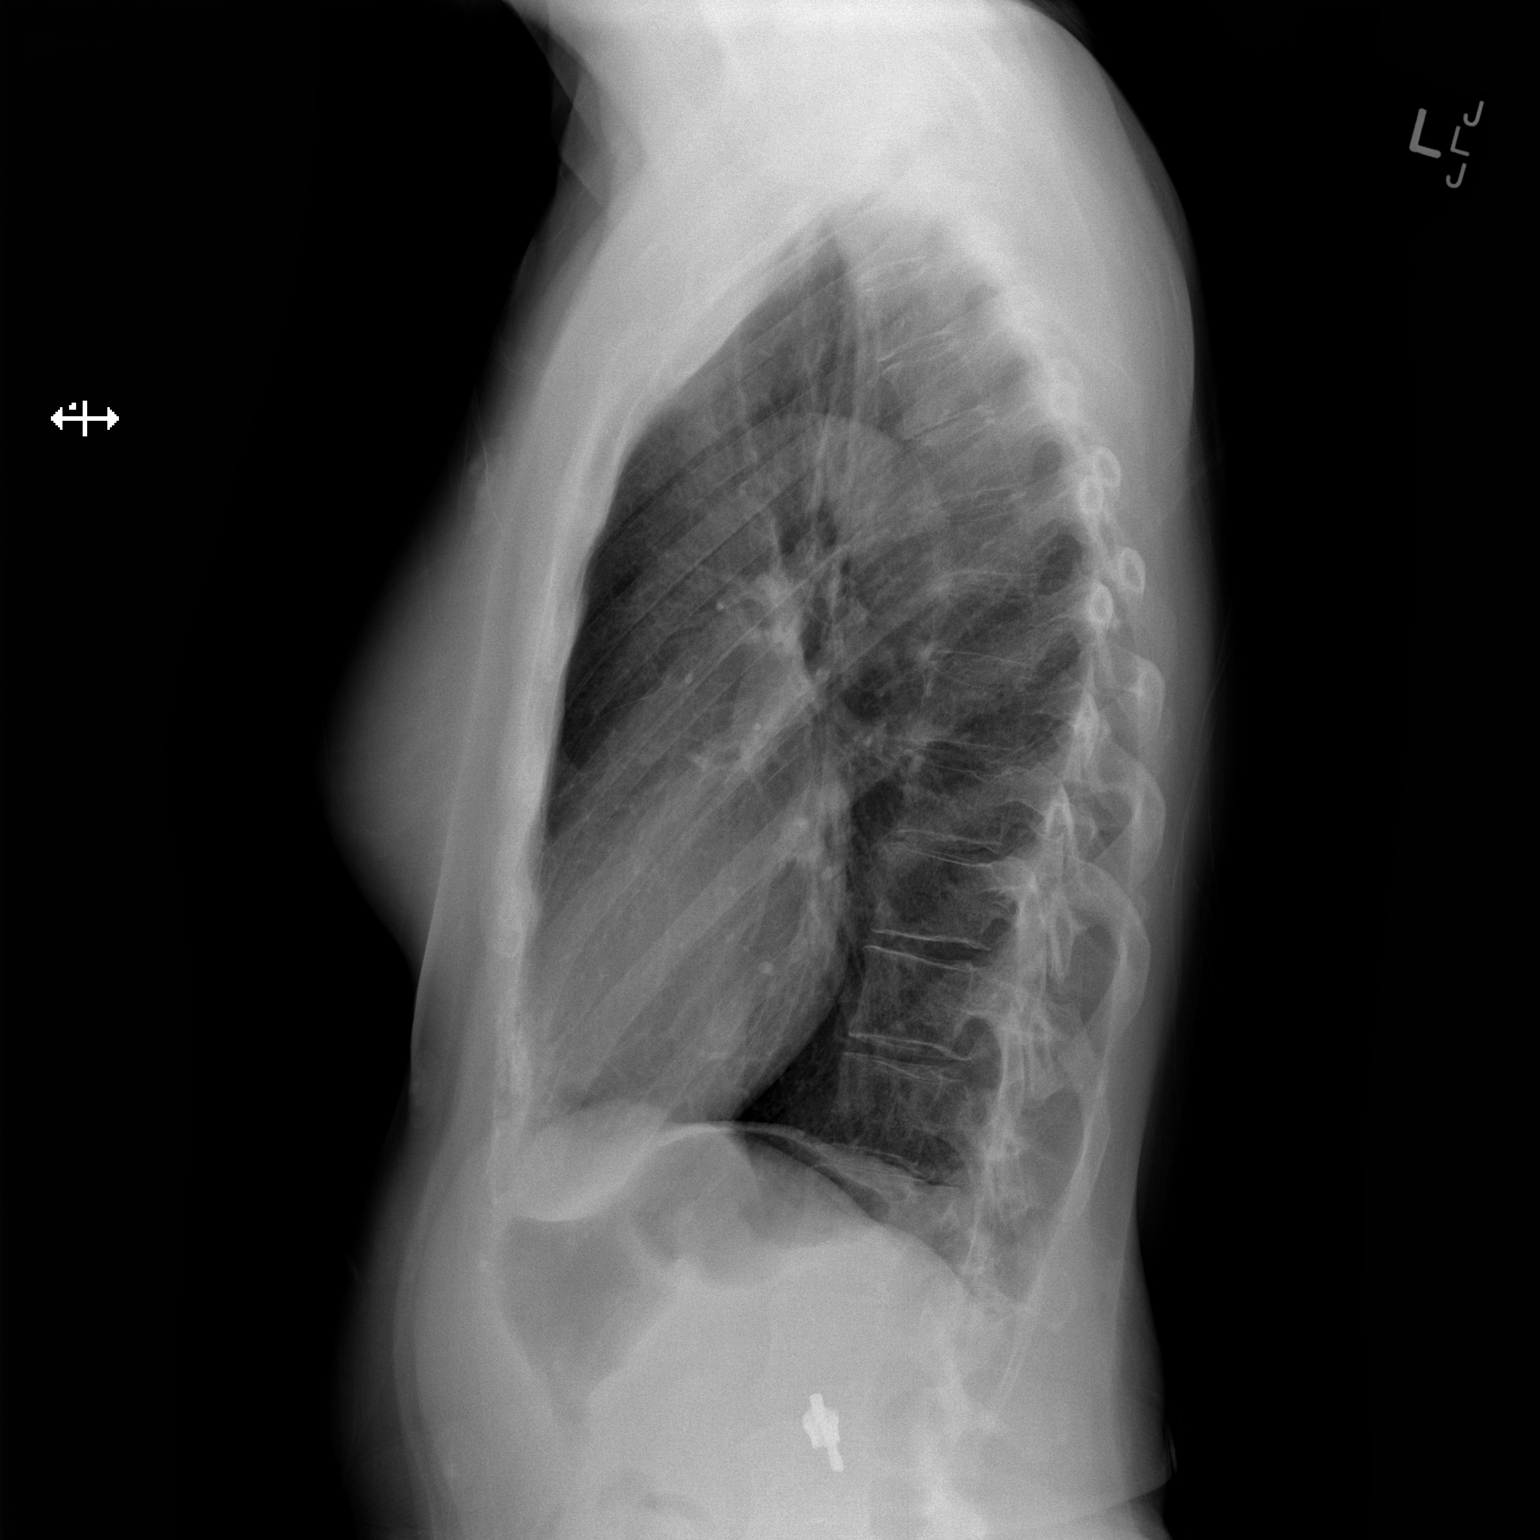

[2 of 2 positions shown; findings below may reference images not displayed]

FINDINGS: The lungs are clear. The heart size and pulmonary vascularity are
normal. No adenopathy.

There is mid thoracic dextroscoliosis. There is atherosclerotic
change in the aorta. No pneumothorax.
IMPRESSION: Scoliosis.  No edema or consolidation.

## 2015-10-17 ENCOUNTER — Telehealth: Payer: Self-pay | Admitting: Certified Nurse Midwife

## 2015-10-17 NOTE — Telephone Encounter (Signed)
Patient called to cancel appointment for tomorrow 10/18/2015 due to her having the flu. Patient says she will call us back to reschedule, recall in.

## 2015-10-18 ENCOUNTER — Ambulatory Visit: Payer: BC Managed Care – PPO | Admitting: Certified Nurse Midwife

## 2016-02-01 NOTE — Telephone Encounter (Signed)
Patient has not call back about rescheduling appt. Do we send letter?

## 2016-02-01 NOTE — Telephone Encounter (Signed)
Letter sent by Starla  

## 2017-05-16 ENCOUNTER — Encounter: Payer: Self-pay | Admitting: Certified Nurse Midwife

## 2018-08-29 ENCOUNTER — Encounter: Payer: Self-pay | Admitting: Certified Nurse Midwife

## 2018-09-30 ENCOUNTER — Encounter: Payer: BC Managed Care – PPO | Admitting: Certified Nurse Midwife

## 2018-10-02 ENCOUNTER — Ambulatory Visit: Payer: BC Managed Care – PPO | Admitting: Certified Nurse Midwife

## 2018-10-02 ENCOUNTER — Encounter: Payer: Self-pay | Admitting: Certified Nurse Midwife

## 2018-10-02 ENCOUNTER — Other Ambulatory Visit: Payer: Self-pay

## 2018-10-02 VITALS — BP 110/68 | HR 68 | Ht 61.75 in | Wt 127.0 lb

## 2018-10-02 DIAGNOSIS — Z01419 Encounter for gynecological examination (general) (routine) without abnormal findings: Secondary | ICD-10-CM

## 2018-10-02 DIAGNOSIS — B372 Candidiasis of skin and nail: Secondary | ICD-10-CM

## 2018-10-02 DIAGNOSIS — N390 Urinary tract infection, site not specified: Secondary | ICD-10-CM | POA: Diagnosis not present

## 2018-10-02 DIAGNOSIS — L723 Sebaceous cyst: Secondary | ICD-10-CM | POA: Diagnosis not present

## 2018-10-02 MED ORDER — NITROFURANTOIN MONOHYD MACRO 100 MG PO CAPS: ORAL_CAPSULE | ORAL | 4 refills | Status: AC

## 2018-10-02 MED ORDER — NYSTATIN 100000 UNIT/GM EX OINT: TOPICAL_OINTMENT | CUTANEOUS | 1 refills | Status: DC

## 2018-10-02 NOTE — Progress Notes (Signed)
63 y.o. [id number] Married  Caucasian Fe here to re-establish care. Here for problem. Complaining of vulva bump she has had for several years and feel it has become sore at times. Denies redness, drainage or squeezing area. Notices more with wearing pants for work[place] Brought records from aex with CNM, no notation of area. Patient also complaining of irritation around right nipple again, history of yeast in past. Does not have Rx anymore. Vaginal dryness still an issue and not interested in estrogen cream prefers alternative. Sees spouse sporadic and would like to be comfortable with sexual activity. Had recurrent UTI after last sexual activity, does not have Macrobid for use anymore. Would like refill. Had pap smear with aex, brought records. No other health issues today.  Patient's last menstrual period was 10/22/2011.          Sexually active: No.  The current method of family planning is post menopausal status.    Exercising: No.  exercise Smoker:  no  Review of Systems  Constitutional: Negative.   HENT: Negative.   Eyes: Negative.   Respiratory: Negative.   Cardiovascular: Negative.   Gastrointestinal: Negative.   Genitourinary: Positive for dysuria. Negative for frequency, hematuria and urgency.       Vaginal dryness UTI with sexual activity only  Musculoskeletal: Negative.   Skin:       Itching right areola area, old issues with yeast  Neurological: Negative.   Endo/Heme/Allergies: Negative.   Psychiatric/Behavioral: Negative.     Health Maintenance: Pap:  05-16-17 neg HPV HR neg History of Abnormal Pap: yes MMG:  01-17-15 category b density birads 1:neg, has scheduled in [place]? Self Breast exams: no Colonoscopy:  Age 53 negative per patient, due BMD:   2016 TDaP:  2010 declines Shingles: unsure Pneumonia: unsure Hep C and HIV: not done Labs: if needed   reports that she has never smoked. She has never used smokeless tobacco. She reports  previous alcohol use. She reports that she does not use drugs.  Past Medical History:  Diagnosis Date  . A-fib (HCC)   . Abnormal Pap smear of cervix   . Anxiety   . Depression   . Dyslipidemia   . Hearing loss     hearing aid in rt ear  . Hypertension   . Lichen sclerosus   . MVP (mitral valve prolapse)   . PONV (postoperative nausea and vomiting)     Past Surgical History:  Procedure Laterality Date  . CESAREAN SECTION    . COLPOSCOPY    . LAPAROSCOPY     exploratory per patient huge scar    Current Outpatient Medications  Medication Sig Dispense Refill  . ALPRAZolam (XANAX) 0.5 MG tablet Take 0.5 mg by mouth 2 (two) times daily as needed for anxiety.     . Ascorbic Acid (VITAMIN C PO) Take by mouth.    Marland Kitchen aspirin EC 81 MG EC tablet Take 1 tablet (81 mg total) by mouth daily. 30 tablet 1  . busPIRone (BUSPAR) 5 MG tablet Take 5 mg by mouth 3 (three) times daily.     . Glucosamine HCl (GLUCOSAMINE PO) Take by mouth daily.    . hydrochlorothiazide (HYDRODIURIL) 12.5 MG tablet TAKE 1 TABLET BY MOUTH ONCE DAILY WITH LOSARTAN    . losartan (COZAAR) 50 MG tablet TAKE 1 TABLET BY MOUTH ONCE DAILY WITH HYDROCHLOROTHIAZIDE    . MAGNESIUM PO Take by mouth.    . Multiple Vitamins-Minerals (MULTIVITAMIN PO) Take by mouth  daily.    . Omega-3 Fatty Acids (FISH OIL PO) Take by mouth daily.    Marland Kitchen PARoxetine HCl (PAXIL PO) Take 40 mg by mouth daily.    . nitrofurantoin, macrocrystal-monohydrate, (MACROBID) 100 MG capsule One capsule after coitus to prevent UTI (Patient not taking: Reported on 10/02/2018) 30 capsule 4   No current facility-administered medications for this visit.     Family History  Problem Relation Age of Onset  . Osteoporosis Mother        severe  . Hypertension Mother   . Heart attack Mother   . Diabetes Father   . Heart attack Father   . Asthma Father   . Coronary artery disease Paternal Grandfather   . Heart attack Paternal Grandfather   . Cancer - Other  Paternal Grandmother        mouth, snuff user  . Coronary artery disease Maternal Grandfather   . Heart attack Maternal Grandfather   . Congestive Heart Failure Maternal Grandmother     ROS:  Pertinent items are noted in HPI.  Otherwise, a comprehensive ROS was negative.  Exam:   BP 110/68   Pulse 68   Ht 5' 1.75" (1.568 m)   Wt 127 lb (57.6 kg)   LMP 10/22/2011   BMI 23.42 kg/m  Height: 5' 1.75" (156.8 cm) Ht Readings from Last 3 Encounters:  10/02/18 5' 1.75" (1.568 m)  03/11/15 5' 2.25" (1.581 m)  10/13/14 5' 2.25" (1.581 m)    General appearance: alert, cooperative and appears stated age Breasts: normal appearance, no masses or tenderness, No nipple retraction or dimpling, No nipple discharge or bleeding, No axillary or supraclavicular adenopathy, small area of increase pink on aerola of right breast with scaling, wet prep taken Heart: regular rate and rhythm Abdomen: soft, non-tender; no masses,  no organomegaly Extremities: extremities normal, atraumatic, no cyanosis or edema Skin: Skin color, texture, turgor normal. No rashes or lesions No abnormal inguinal nodes palpated Neurologic: Grossly normal   Pelvic: External genitalia:  Normal female, sebaceous cysts noted on right vulva, not red, not firm, tiny pea size, not tender, small area of redness in right labia crease from scratchy              Urethra:  normal appearing urethra with no masses, tenderness or lesions              Bartholin's and Skene's: normal                 Vagina: normal appearing vagina with normal color and scant discharge, no lesions, dryness noted, introital only, declined speculum exam              Cervix: not visualized              Pap taken: No. Bimanual Exam: Declined               Anus:  normal appearance, no lesions  Chaperone present: yes  A:  Normal breast exam  Yeast noted on areola with + KOH,Saline wet prep  Right vulva sebaceous cysts(2), not inflamed small  History of post  coital UTI  Dyspareunia due to vaginal dryness   P:   Reviewed health and wellness pertinent to exam  Discussed finding of yeast and possible etiology.  Rx Nystatin bid x 7 see order with instructions  Discussed and shown area in mirror of sebaceous cyst and suggested 1 % cortisone to area once daily when feeling irritated. Avoid long term use  due to thinning of tissue. Would not advise removal due small size. Epsom salt tub bath with massage to area may resolve her concerns with size. Warning signs given.  Discussed importance of emptying bladder before and after intercourse.  Rx Macrobid see order with instructions  Discussed coconut oil or Olive oil use 3-4 times weekly for comfort and with intercourse. Questions addressed.  counseled on feminine hygiene, adequate intake of calcium and vitamin D, diet and exercise, Kegel's exercises  return annually or prn  An After Visit Summary was printed and given to the patient.

## 2018-11-13 ENCOUNTER — Telehealth: Payer: Self-pay | Admitting: Certified Nurse Midwife

## 2018-11-13 NOTE — Telephone Encounter (Signed)
She needs to be evaluated in person. If she doesn't want to come here she should see a local MD. She could see her primary.

## 2018-11-13 NOTE — Telephone Encounter (Signed)
Spoke with patient. Patient reports left breast itching, sore to touch and hurts when clothing touches skin. Started nystatin cream 5 days ago as prescribed at [facility] by DL on 04/08/90, symptoms worsened with use. Stopped medication last night. Patient states this happens to her each year after consuming too much sugar at Christmas. Has used "clobetasol betamethasone clotrimazole 1% -0.05%" in the past, not nystatin. Patient declines OV, lives 4hrs away. Denies fever/chills, nipple d/c, lumps. Advised will review with covering provider and return call with recommendations, patient agreeable.   Dr. Oscar La -please review, is WebEx and option?

## 2018-11-13 NOTE — Telephone Encounter (Signed)
Spoke with patient, advised as seen below per Dr. Oscar La. Patient does have PCP, will check on options. Patient verbalizes understanding and is agreeable.   Encounter closed.

## 2018-11-13 NOTE — Telephone Encounter (Signed)
Patient calling to discuss alternative medication for yeast infection. States the wrong one was given to her.

## 2019-01-15 ENCOUNTER — Telehealth: Payer: Self-pay | Admitting: Certified Nurse Midwife

## 2019-01-15 NOTE — Telephone Encounter (Signed)
Patient is asking to talk with Nicole Warren directly asking for a recommendation to another doctor. I told her a nurse would call her back, she said "I would like to talk with Nicole Warren".

## 2019-01-15 NOTE — Telephone Encounter (Signed)
Routing to [employer], CNM and Royal Hawthorn, [place].

## 2019-01-19 NOTE — Telephone Encounter (Signed)
Patient is calling to follow up from last week.

## 2019-01-20 NOTE — Telephone Encounter (Signed)
Left message to call back  

## 2019-01-20 NOTE — Telephone Encounter (Signed)
Spoke with patient who had some concerns regarding the problem she had with yeast. Routing to Melvia Heaps, CNM. Patient would like to speak with her regarding it.

## 2019-01-21 NOTE — Telephone Encounter (Signed)
Phone to call patient to discuss concerns with care provided by phone. Patient was seen 10/02/2018 for problem of vulva bump, she  had noted previously and was seen by last practice, has reoccurred and irritation on right nipple again( history of yeast in past, treated with Nystatin previously, no Rx now) She also had post coital UTI and does not have Macrobid for use, and needed Rx. Patient states she called in and was told there was no notation she had yeast on breast in chart and could see her PCP or have appointment here. She lives 4 hours away and declined appointment and was concerned her notes did not reflect the yeast. Reviewed note and confirmed this was noted and Triage RN also noted seen. Patient used medication earlier with relief but returned with redness.. She saw PCP and treated with Diflucan. Concerned she could had this to begin with.  Also sebaceous cyst noted on vulva and not inflamed or red when seen. No treatment indicated. She has appointment now with dermatology for removal per PCP.  Also voiced concern that wanted to schedule daughter here for first exam, but only option was MD who was consulted regarding breast, so she declined. Patient felt her needs were not met here and plans to establish care locally(4 hours away). Expressed she would be missed as I cared for her for many years and during pregnancy with daughter. Wished family well.

## 2019-10-13 ENCOUNTER — Encounter: Payer: Self-pay | Admitting: Certified Nurse Midwife
